# Patient Record
Sex: Male | Born: 1965 | Race: Asian | Hispanic: No | Marital: Married | Smoking: Former smoker
Health system: Southern US, Community
[De-identification: ages and names within clinical notes are randomized; demographics above are authoritative.]

## PROBLEM LIST (undated history)

## (undated) DIAGNOSIS — I2510 Atherosclerotic heart disease of native coronary artery without angina pectoris: Secondary | ICD-10-CM

## (undated) DIAGNOSIS — I10 Essential (primary) hypertension: Secondary | ICD-10-CM

## (undated) DIAGNOSIS — K219 Gastro-esophageal reflux disease without esophagitis: Secondary | ICD-10-CM

## (undated) DIAGNOSIS — E119 Type 2 diabetes mellitus without complications: Secondary | ICD-10-CM

## (undated) DIAGNOSIS — Z720 Tobacco use: Secondary | ICD-10-CM

## (undated) DIAGNOSIS — E785 Hyperlipidemia, unspecified: Secondary | ICD-10-CM

## (undated) HISTORY — PX: CORONARY STENT PLACEMENT: SHX1402

## (undated) HISTORY — PX: SHOULDER SURGERY: SHX246

---

## 2013-12-19 ENCOUNTER — Ambulatory Visit (INDEPENDENT_AMBULATORY_CARE_PROVIDER_SITE_OTHER): Payer: BC Managed Care – PPO | Admitting: Internal Medicine

## 2013-12-19 ENCOUNTER — Emergency Department (HOSPITAL_COMMUNITY): Payer: BC Managed Care – PPO

## 2013-12-19 ENCOUNTER — Encounter (HOSPITAL_COMMUNITY): Payer: Self-pay | Admitting: Emergency Medicine

## 2013-12-19 ENCOUNTER — Inpatient Hospital Stay (HOSPITAL_COMMUNITY)
Admission: EM | Admit: 2013-12-19 | Discharge: 2013-12-21 | DRG: 247 | Disposition: A | Discharge status: Home / Self Care | Payer: BC Managed Care – PPO | Attending: Cardiology | Admitting: Cardiology

## 2013-12-19 VITALS — BP 140/84 | HR 95 | Temp 97.3°F | Resp 16 | Ht 72.5 in | Wt 227.0 lb

## 2013-12-19 DIAGNOSIS — I214 Non-ST elevation (NSTEMI) myocardial infarction: Secondary | ICD-10-CM

## 2013-12-19 DIAGNOSIS — N189 Chronic kidney disease, unspecified: Secondary | ICD-10-CM | POA: Diagnosis present

## 2013-12-19 DIAGNOSIS — R079 Chest pain, unspecified: Secondary | ICD-10-CM

## 2013-12-19 DIAGNOSIS — E119 Type 2 diabetes mellitus without complications: Secondary | ICD-10-CM

## 2013-12-19 DIAGNOSIS — IMO0002 Reserved for concepts with insufficient information to code with codable children: Secondary | ICD-10-CM | POA: Insufficient documentation

## 2013-12-19 DIAGNOSIS — I25119 Atherosclerotic heart disease of native coronary artery with unspecified angina pectoris: Secondary | ICD-10-CM

## 2013-12-19 DIAGNOSIS — E1165 Type 2 diabetes mellitus with hyperglycemia: Secondary | ICD-10-CM | POA: Diagnosis present

## 2013-12-19 DIAGNOSIS — K219 Gastro-esophageal reflux disease without esophagitis: Secondary | ICD-10-CM | POA: Diagnosis present

## 2013-12-19 DIAGNOSIS — M19019 Primary osteoarthritis, unspecified shoulder: Secondary | ICD-10-CM | POA: Diagnosis present

## 2013-12-19 DIAGNOSIS — I10 Essential (primary) hypertension: Secondary | ICD-10-CM | POA: Insufficient documentation

## 2013-12-19 DIAGNOSIS — Z7982 Long term (current) use of aspirin: Secondary | ICD-10-CM

## 2013-12-19 DIAGNOSIS — I2510 Atherosclerotic heart disease of native coronary artery without angina pectoris: Principal | ICD-10-CM

## 2013-12-19 DIAGNOSIS — F1010 Alcohol abuse, uncomplicated: Secondary | ICD-10-CM | POA: Diagnosis present

## 2013-12-19 DIAGNOSIS — Z7902 Long term (current) use of antithrombotics/antiplatelets: Secondary | ICD-10-CM

## 2013-12-19 DIAGNOSIS — R1013 Epigastric pain: Secondary | ICD-10-CM

## 2013-12-19 DIAGNOSIS — F17200 Nicotine dependence, unspecified, uncomplicated: Secondary | ICD-10-CM | POA: Diagnosis present

## 2013-12-19 DIAGNOSIS — I200 Unstable angina: Secondary | ICD-10-CM | POA: Diagnosis present

## 2013-12-19 DIAGNOSIS — T82868A Thrombosis of vascular prosthetic devices, implants and grafts, initial encounter: Secondary | ICD-10-CM

## 2013-12-19 DIAGNOSIS — IMO0001 Reserved for inherently not codable concepts without codable children: Secondary | ICD-10-CM | POA: Diagnosis present

## 2013-12-19 DIAGNOSIS — E785 Hyperlipidemia, unspecified: Secondary | ICD-10-CM | POA: Diagnosis present

## 2013-12-19 DIAGNOSIS — I129 Hypertensive chronic kidney disease with stage 1 through stage 4 chronic kidney disease, or unspecified chronic kidney disease: Secondary | ICD-10-CM | POA: Diagnosis present

## 2013-12-19 DIAGNOSIS — Z720 Tobacco use: Secondary | ICD-10-CM

## 2013-12-19 DIAGNOSIS — I2582 Chronic total occlusion of coronary artery: Secondary | ICD-10-CM | POA: Diagnosis present

## 2013-12-19 DIAGNOSIS — Z800 Family history of malignant neoplasm of digestive organs: Secondary | ICD-10-CM

## 2013-12-19 DIAGNOSIS — Z79899 Other long term (current) drug therapy: Secondary | ICD-10-CM

## 2013-12-19 DIAGNOSIS — K3189 Other diseases of stomach and duodenum: Secondary | ICD-10-CM | POA: Diagnosis not present

## 2013-12-19 DIAGNOSIS — Z955 Presence of coronary angioplasty implant and graft: Secondary | ICD-10-CM

## 2013-12-19 HISTORY — DX: Tobacco use: Z72.0

## 2013-12-19 HISTORY — DX: Gastro-esophageal reflux disease without esophagitis: K21.9

## 2013-12-19 HISTORY — DX: Hyperlipidemia, unspecified: E78.5

## 2013-12-19 HISTORY — DX: Atherosclerotic heart disease of native coronary artery without angina pectoris: I25.10

## 2013-12-19 HISTORY — DX: Type 2 diabetes mellitus without complications: E11.9

## 2013-12-19 HISTORY — DX: Essential (primary) hypertension: I10

## 2013-12-19 LAB — CBC WITH DIFFERENTIAL/PLATELET
BASOS ABS: 0 10*3/uL (ref 0.0–0.1)
Basophils Relative: 0 % (ref 0–1)
EOS ABS: 0.1 10*3/uL (ref 0.0–0.7)
EOS PCT: 1 % (ref 0–5)
HCT: 43 % (ref 39.0–52.0)
Hemoglobin: 15.6 g/dL (ref 13.0–17.0)
LYMPHS ABS: 1.6 10*3/uL (ref 0.7–4.0)
Lymphocytes Relative: 21 % (ref 12–46)
MCH: 31.3 pg (ref 26.0–34.0)
MCHC: 36.3 g/dL — ABNORMAL HIGH (ref 30.0–36.0)
MCV: 86.3 fL (ref 78.0–100.0)
MONO ABS: 0.4 10*3/uL (ref 0.1–1.0)
Monocytes Relative: 6 % (ref 3–12)
Neutro Abs: 5.3 10*3/uL (ref 1.7–7.7)
Neutrophils Relative %: 72 % (ref 43–77)
Platelets: 236 10*3/uL (ref 150–400)
RBC: 4.98 MIL/uL (ref 4.22–5.81)
RDW: 12.2 % (ref 11.5–15.5)
WBC: 7.3 10*3/uL (ref 4.0–10.5)

## 2013-12-19 LAB — BASIC METABOLIC PANEL
BUN: 20 mg/dL (ref 6–23)
CO2: 21 meq/L (ref 19–32)
CREATININE: 0.58 mg/dL (ref 0.50–1.35)
Calcium: 8.8 mg/dL (ref 8.4–10.5)
Chloride: 98 mEq/L (ref 96–112)
GFR calc Af Amer: >90 mL/min (ref >90)
GFR calc non Af Amer: >90 mL/min (ref >90)
GLUCOSE: 349 mg/dL — AB (ref 70–99)
Potassium: 4 mEq/L (ref 3.7–5.3)
Sodium: 136 mEq/L — ABNORMAL LOW (ref 137–147)

## 2013-12-19 LAB — POCT I-STAT TROPONIN I: TROPONIN I, POC: 0.09 ng/mL — AB (ref 0.00–0.08)

## 2013-12-19 LAB — GLUCOSE, POCT (MANUAL RESULT ENTRY): POC Glucose: 428 mg/dl — AB (ref 70–99)

## 2013-12-19 LAB — TROPONIN I

## 2013-12-19 MED ORDER — ONDANSETRON HCL 4 MG/2ML IJ SOLN
4.0000 mg | Freq: Once | INTRAMUSCULAR | Status: DC
  Filled 2013-12-19: qty 2

## 2013-12-19 MED ORDER — HEPARIN (PORCINE) IN NACL 100-0.45 UNIT/ML-% IJ SOLN
1350.0000 [IU]/h | INTRAMUSCULAR | Status: DC
  Administered 2013-12-19: 1200 [IU]/h via INTRAVENOUS
  Filled 2013-12-19 (×2): qty 250

## 2013-12-19 MED ORDER — ASPIRIN 81 MG PO CHEW
324.0000 mg | CHEWABLE_TABLET | Freq: Once | ORAL | Status: AC
  Administered 2013-12-19: 324 mg via ORAL

## 2013-12-19 MED ORDER — MORPHINE SULFATE 4 MG/ML IJ SOLN
4.0000 mg | INTRAMUSCULAR | Status: DC | PRN
  Filled 2013-12-19: qty 1

## 2013-12-19 MED ORDER — NITROGLYCERIN 0.4 MG SL SUBL
0.4000 mg | SUBLINGUAL_TABLET | SUBLINGUAL | Status: DC | PRN
  Administered 2013-12-19 (×2): 0.4 mg via SUBLINGUAL
  Filled 2013-12-19 (×2): qty 25

## 2013-12-19 MED ORDER — HEPARIN (PORCINE) IN NACL 100-0.45 UNIT/ML-% IJ SOLN: 12.0000 [IU]/kg/h | INTRAMUSCULAR | Status: DC

## 2013-12-19 MED ORDER — HEPARIN SODIUM (PORCINE) 5000 UNIT/ML IJ SOLN: 60.0000 [IU]/kg | Freq: Once | INTRAMUSCULAR | Status: DC

## 2013-12-19 MED ORDER — HEPARIN BOLUS VIA INFUSION
4000.0000 [IU] | Freq: Once | INTRAVENOUS | Status: AC
  Administered 2013-12-19: 4000 [IU] via INTRAVENOUS
  Filled 2013-12-19: qty 4000

## 2013-12-19 NOTE — ED Notes (Signed)
EKG was done at 20:35

## 2013-12-19 NOTE — ED Notes (Signed)
Per wife, pt had sudden onset chest pain radiating to left neck for approx x10 minutes and then eased off. Pt. Was SOB, dizziness and diaphoretic with "big heart beat". Pt. Then had another episode and went to urgent care. Pt. Currently comfortable sitting in bed

## 2013-12-19 NOTE — H&P (Signed)
History and Physical  Patient ID: Drema HalonSeungwoo Miranda MRN: [id number], SOB: 1966/06/27 48 y.o. Date of Encounter: 12/19/2013, 10:58 PM  Primary Physician: Dr. Evelina BucyEdwin Aubuere with [facility] Primary Cardiologist: unassigned  Chief Complaint: chest pain  HPI: 48 y.o. male w/ PMHx significant for HTN, diabetes, tobacco abuse who presented to urgent care initially with chest pain and then [facility] with complaints of chest pain.  Patient with limited English. Interpretation provided by wife. No known cardiac history. Several episodes today of chest discomfort. Initially starts as a "hard heartbeat" with discomfort radiating up into his neck. Symptoms last for 15 minutes and then resolve. Associated with palpitations, lightheadedness, diaphoresis and radiation to neck. No syncope. Occurred with shelling shrimp but also with resting. Yesterday felt fine, no symptoms. Last night, frequent urination but otherwise okay.  At urgent care, was given aspirin and transferred due to EKG changes. Was still having dull chest pressure, relieved with nitro x 2.  EKG revealed NSR with j point in V2-v3, no reciprocal changes, widened qrs (no comparison). CXR was without acute cardiopulmonary abnormalities. Labs are significant for elevated POC troponin and glucose > 350. xcray without changes per ER. Heparin started.   PMH: 1. HTN 2. DM2, on orals 3. GERD 4. Tobacco abuse 5. H/o of excessive ETOH 6. Arthritis of the R shoulder  Surgical History:  Past Surgical History  Procedure Laterality Date  . Shoulder surgery Right      Home Meds: Prior to Admission medications   Medication Sig Start Date End Date Taking? Authorizing Provider  lisinopril (PRINIVIL,ZESTRIL) 20 MG tablet Take 20 mg by mouth 2 (two) times daily.    Yes Historical Provider, MD  metFORMIN (GLUCOPHAGE) 500 MG tablet Take 500 mg by mouth 2 (two) times daily.   Yes Historical Provider, MD   SH: 1  ppd tobacco Sounds like occasional binge drinking, previously heavy daily drinker  Allergies: No Known Allergies  FH: Liver cancer in father No cardiac dz in either m or f   Review of Systems:* General: negative for chills, fever, night sweats or weight changes.  Cardiovascular: see HPI Dermatological: negative for rash Respiratory: negative for cough or wheezing Urologic: negative for hematuria Abdominal: negative for nausea, vomiting, diarrhea, bright red blood per rectum, melena, or hematemesis Neurologic: negative for visual changes, syncope, or dizziness All other systems reviewed and are otherwise negative except as noted above.  Labs:   Lab Results  Component Value Date   WBC 7.3 12/19/2013   HGB 15.6 12/19/2013   HCT 43.0 12/19/2013   MCV 86.3 12/19/2013   PLT 236 12/19/2013    Recent Labs Lab 12/19/13 2109  NA 136*  K 4.0  CL 98  CO2 21  BUN 20  CREATININE 0.58  CALCIUM 8.8  GLUCOSE 349*   No results found for this basename: CKTOTAL, CKMB, TROPONINI,  in the last 72 hours No results found for this basename: CHOL, HDL, LDLCALC, TRIG   No results found for this basename: DDIMER    Radiology/Studies:  No results found.   EKG: sinus, j point in v2-v3, no reciprocal changes. widened qrs,  cxray pending.  Physical Exam: Blood pressure 115/81, pulse 71, temperature 97.8 F (36.6 C), temperature source Oral, resp. rate 17, SpO2 99.00%. General: Well developed, well nourished, in no acute distress. Head: Normocephalic, atraumatic, sclera non-icteric, nares are without discharge Neck: Supple. Negative for carotid bruits. JVD not elevated. Lungs: Clear bilaterally to auscultation without wheezes, rales,  or rhonchi. Breathing is unlabored. Heart: RRR with S1 S2. No murmurs, rubs, or gallops appreciated. Abdomen: Soft, non-tender, non-distended with normoactive bowel sounds. No rebound/guarding. No obvious abdominal masses. Msk:  Strength and tone appear normal  for age. Extremities: No edema. No clubbing or cyanosis. Distal pedal pulses are 2+ and equal bilaterally. Neuro: Alert and oriented X 3. Moves all extremities spontaneously. Psych:  Responds to questions appropriately with a normal affect.   Problem List 1. Chest pain, typical with elevated troponin c/w NSTEMI 2. DM2, poorly controlled 3. Tobacco abuse 4. Hypertension 5. H/o excessive ETOH, improved  ASSESSMENT AND PLAN:  48 y.o. male w/ PMHx significant for HTN, diabetes, tobacco abuse who presented to urgent care initially with chest pain and then transferred to [facility] on 12/19/2013 with complaints of chest pain --> POC troponin slightly elevated.  Based upon symptoms (typical chest pain) and risk factors (tobacco, HTN, DM2), very high suspicion for acute coronary syndrome which is further supported by the slightly elevated POC troponin. Abnormal EKG with IVCD, nonspecific. Repeat troponin pending. Encouragingly, he is currently chest pain free. Treat with beta blocker, statin, aspirin, heparin due to elevated TIMI score, ACEI and prepare for invasive strategy in the AM.   Smoking cessation discussed > 5 minutes and strongly emphasized. Wife is on board, husband precontemplative.  Diabetes poorly controlled. Will check HgA1c but likely will need increased metformin at discharged. Holding in anticipation of dye, covering with sliding scale.  Discussed ETOH. Sounds like it was a problem in the past now improved. Emphasized that for a male, more than 2 drinks a day was considered excessive.  Prophylaxis: Heparin gtt NPO for cath  Full code.  Signed, Adolm Joseph, Troy Sine MD 12/19/2013, 10:58 PM

## 2013-12-19 NOTE — ED Provider Notes (Signed)
CSN: [id number]     Arrival date & time 12/19/13  2028 History   First MD Initiated Contact with Patient 12/19/13 2033     Chief Complaint  Patient presents with  . Chest Pain  . Hyperglycemia      HPI  Patient presents with an episode of chest pain. Has no documented history of cardiac disease. First episode was approximately 3 hours before his arrival here. He describes sensation of the heartbeat in his chest. He felt pain in his left chest and into his left neck and jaw. This lasted for about 10 minutes. He was standing at the time. He states he felt dizzy, however he did not sit down. This past. He then laid down. Approximately 30 minutes later recurred again. Again a sudden episode of pain in the left chest and neck. This again lasted 10-20 minutes. He presents here without the acute pain but with some persistent heaviness in his anterior chest.  Not currently short of breath not currently nauseated.   History reviewed. No pertinent past medical history. Past Surgical History  Procedure Laterality Date  . Shoulder surgery Right    History reviewed. No pertinent family history. History  Substance Use Topics  . Smoking status: Not on file  . Smokeless tobacco: Not on file  . Alcohol Use: Not on file    Review of Systems  Constitutional: Negative for fever, chills, diaphoresis, appetite change and fatigue.  HENT: Negative for mouth sores, sore throat and trouble swallowing.   Eyes: Negative for visual disturbance.  Respiratory: Negative for cough, chest tightness, shortness of breath and wheezing.   Cardiovascular: Positive for chest pain.  Gastrointestinal: Negative for nausea, vomiting, abdominal pain, diarrhea and abdominal distention.  Endocrine: Negative for polydipsia, polyphagia and polyuria.  Genitourinary: Negative for dysuria, frequency and hematuria.  Musculoskeletal: Negative for gait problem.  Skin: Negative for color change, pallor and rash.  Neurological:  Negative for dizziness, syncope, light-headedness and headaches.  Hematological: Does not bruise/bleed easily.  Psychiatric/Behavioral: Negative for behavioral problems and confusion.      Allergies  Review of patient's allergies indicates no known allergies.  Home Medications   Current Outpatient Rx  Name  Route  Sig  Dispense  Refill  . lisinopril (PRINIVIL,ZESTRIL) 20 MG tablet   Oral   Take 20 mg by mouth 2 (two) times daily.          . metFORMIN (GLUCOPHAGE) 500 MG tablet   Oral   Take 500 mg by mouth 2 (two) times daily.          BP 117/81  Pulse 69  Temp(Src) 97.8 F (36.6 C) (Oral)  Resp 11  Ht 6' 0.44" (1.84 m)  Wt 227 lb 1.2 oz (103 kg)  BMI 30.42 kg/m2  SpO2 96% Physical Exam  Constitutional: He is oriented to person, place, and time. He appears well-developed and well-nourished. No distress.  HENT:  Head: Normocephalic.  Eyes: Conjunctivae are normal. Pupils are equal, round, and reactive to light. No scleral icterus.  Neck: Normal range of motion. Neck supple. No thyromegaly present.  Cardiovascular: Normal rate and regular rhythm.  Exam reveals no gallop and no friction rub.   No murmur heard. Pulmonary/Chest: Effort normal and breath sounds normal. No respiratory distress. He has no wheezes. He has no rales.  Abdominal: Soft. Bowel sounds are normal. He exhibits no distension. There is no tenderness. There is no rebound.  Musculoskeletal: Normal range of motion.  Neurological: He is alert and oriented  to person, place, and time.  Skin: Skin is warm and dry. No rash noted.  Psychiatric: He has a normal mood and affect. His behavior is normal.    ED Course  Procedures (including critical care time) Labs Review Labs Reviewed  CBC WITH DIFFERENTIAL - Abnormal; Notable for the following:    MCHC 36.3 (*)    All other components within normal limits  BASIC METABOLIC PANEL - Abnormal; Notable for the following:    Sodium 136 (*)    Glucose, Bld 349  (*)    All other components within normal limits  POCT I-STAT TROPONIN I - Abnormal; Notable for the following:    Troponin i, poc 0.09 (*)    All other components within normal limits  TROPONIN I  HEPARIN LEVEL (UNFRACTIONATED)  CBC   Imaging Review No results found.  EKG Interpretation   None       MDM   Final diagnoses:  Chest pain    Became pain free after 2 nitroglycerin here. Shows repolarization abnormality her elevated J point without reciprocal changes in lead V2 only. This is not dynamic. It is unchanged after his pain resolves. Procedure pointed at 0.09. Lab troponin is less than 0.30.    Discussed case with cardiology. M.D. cardiology evaluation emergency room. I also recommend heparin bolus and drip. He has no contraindications. His given. He remains asymptomatic. He was given aspirin prior to his arrival 324 mg.    Rolland PorterMark Nell Gales, MD 12/19/13 2352

## 2013-12-19 NOTE — ED Notes (Addendum)
Jody Oberthaler-RN notified of elevated I-stat troponin. Dr. Fayrene FearingJames was unavailable so I placed slip on desk and notified  PA.

## 2013-12-19 NOTE — Progress Notes (Signed)
   Subjective:    Patient ID: Tristan Camacho, male    DOB: 23-Jul-1966, 48 y.o.   MRN: [id number]  HPI Pt presents to clinic with 1 hr h/o chest pain.  Started with what felt like a 'funny hard heart beat" and he got dizzy.  Then he developed chest pain which got a little better after about an hour, currently he is pain free.  He has had no ASA today.  He has known h/o DM which sugars running 200-300, and HTN - on meds for both.  When the chest pain started he had nausea with pain radiating into his left neck and shoulder.  He has never seen a cardiologist before.  He sees his PCP but unsure when he saw them last and what his last A1C was.   Pt speaks broken [place] - wife interprets most of visit.  Review of Systems  Constitutional: Positive for diaphoresis. Negative for fever and chills.  Cardiovascular: Positive for chest pain. Negative for palpitations.  Gastrointestinal: Positive for nausea.  Neurological: Positive for dizziness. Negative for headaches.       Objective:   Physical Exam  Vitals reviewed. Constitutional: He is oriented to person, place, and time. He appears well-developed and well-nourished.  HENT:  Head: Normocephalic and atraumatic.  Right Ear: External ear normal.  Left Ear: External ear normal.  Eyes: Conjunctivae are normal.  Neck: Normal range of motion.  Cardiovascular: Normal rate and normal heart sounds.   No murmur heard. Pulmonary/Chest: Effort normal and breath sounds normal. He has no wheezes.  Neurological: He is alert and oriented to person, place, and time.  Skin: Skin is warm and dry.  Psychiatric: He has a normal mood and affect. His behavior is normal. Judgment and thought content normal.   Results for orders placed in visit on 12/19/13  GLUCOSE, POCT (MANUAL RESULT ENTRY)      Result Value Range   POC Glucose 428 (*) 70 - 99 mg/dl   EKG - ST elevation in V3, V4     Assessment & Plan:  Chest pain - Plan: EKG 12-Lead, aspirin chewable  tablet 324 mg  Diabetes - Plan: POCT glucose (manual entry)  Diabetes mellitus type II, uncontrolled  HTN (hypertension)  Due to abnl EKG in the anterior leads and h/o HTN and uncontrolled DM - pt placed on O2, IV was started, he was given ASA 324mg  and [facility] EMS [facility].  D/w Dr Merla Richesoolittle.  Benny LennertSarah Veneda Kirksey PA-C 12/19/2013 8:16 PM  I was involved in this emergency case from the beginning, and I agree with the diagnosis and plan for emergent emergency room transfer as documented Robert P. Sandria Balesoolittle M.D.

## 2013-12-19 NOTE — Progress Notes (Signed)
ANTICOAGULATION CONSULT NOTE - Initial Consult  Pharmacy Consult for Heparin Indication: chest pain/ACS  No Known Allergies  Patient Measurements: Height: 6' 0.44" (184 cm) Weight: 227 lb 1.2 oz (103 kg) IBW/kg (Calculated) : 78.61 Heparin Dosing Weight: 100 kg  Vital Signs: Temp: 97.8 F (36.6 C) (02/10 2041) Temp src: Oral (02/10 2041) BP: 117/81 mmHg (02/10 2245) Pulse Rate: 69 (02/10 2245)  Labs:  Recent Labs  12/19/13 2109  HGB 15.6  HCT 43.0  PLT 236  CREATININE 0.58    Estimated Creatinine Clearance: 139.7 ml/min (by C-G formula based on Cr of 0.58).   Medical History: Htn  DM History reviewed. No pertinent past medical history.  Medications:  Zestril  Metformin  Assessment: 48 yo male with chest pain for heparin  Goal of Therapy:  Heparin level 0.3-0.7 units/ml Monitor platelets by anticoagulation protocol: Yes   Plan:  Heparin 4000 units Iv bolus, then 1200 units/hr Check heparin level in 6 hours.   Eddie Candlebbott, Maurya Nethery Vernon 12/19/2013,11:04 PM

## 2013-12-19 NOTE — ED Notes (Addendum)
Pt presents via [facility] from [facility] with c/o substernal CP with radiation to left jaw with heart palpitations and dizziness that began that began at approximately 1800 and lasted for approx 1 hour. CBG 320. Hx Diabetes and HTN. Pt given 325mg  ASA PTA. Pt is currently pain free.

## 2013-12-20 ENCOUNTER — Encounter (HOSPITAL_COMMUNITY)
Admission: EM | Disposition: A | Discharge status: Home / Self Care | Payer: BC Managed Care – PPO | Source: Home / Self Care | Attending: Cardiology

## 2013-12-20 ENCOUNTER — Encounter (HOSPITAL_COMMUNITY): Payer: Self-pay

## 2013-12-20 DIAGNOSIS — I2510 Atherosclerotic heart disease of native coronary artery without angina pectoris: Principal | ICD-10-CM

## 2013-12-20 DIAGNOSIS — E1165 Type 2 diabetes mellitus with hyperglycemia: Secondary | ICD-10-CM

## 2013-12-20 DIAGNOSIS — IMO0001 Reserved for inherently not codable concepts without codable children: Secondary | ICD-10-CM

## 2013-12-20 DIAGNOSIS — I10 Essential (primary) hypertension: Secondary | ICD-10-CM

## 2013-12-20 HISTORY — PX: LEFT HEART CATHETERIZATION WITH CORONARY ANGIOGRAM: SHX5451

## 2013-12-20 LAB — GLUCOSE, CAPILLARY
GLUCOSE-CAPILLARY: 153 mg/dL — AB (ref 70–99)
Glucose-Capillary: 258 mg/dL — ABNORMAL HIGH (ref 70–99)
Glucose-Capillary: 150 mg/dL — ABNORMAL HIGH (ref 70–99)
Glucose-Capillary: 157 mg/dL — ABNORMAL HIGH (ref 70–99)
Glucose-Capillary: 234 mg/dL — ABNORMAL HIGH (ref 70–99)

## 2013-12-20 LAB — BASIC METABOLIC PANEL
BUN: 18 mg/dL (ref 6–23)
CO2: 22 meq/L (ref 19–32)
CREATININE: 0.62 mg/dL (ref 0.50–1.35)
Calcium: 8.6 mg/dL (ref 8.4–10.5)
Chloride: 99 mEq/L (ref 96–112)
GFR calc Af Amer: >90 mL/min (ref >90)
GFR calc non Af Amer: >90 mL/min (ref >90)
GLUCOSE: 213 mg/dL — AB (ref 70–99)
Potassium: 3.8 mEq/L (ref 3.7–5.3)
Sodium: 136 mEq/L — ABNORMAL LOW (ref 137–147)

## 2013-12-20 LAB — TROPONIN I
Troponin I: <0.3 ng/mL (ref <0.30)
Troponin I: <0.3 ng/mL (ref <0.30)

## 2013-12-20 LAB — HEPARIN LEVEL (UNFRACTIONATED): Heparin Unfractionated: 0.33 IU/mL (ref 0.30–0.70)

## 2013-12-20 LAB — POCT ACTIVATED CLOTTING TIME: Activated Clotting Time: 515 seconds

## 2013-12-20 LAB — LIPID PANEL
CHOL/HDL RATIO: 4.1 ratio
CHOLESTEROL: 157 mg/dL (ref 0–200)
HDL: 38 mg/dL — AB (ref >39)
LDL Cholesterol: 88 mg/dL (ref 0–99)
TRIGLYCERIDES: 154 mg/dL — AB (ref <150)
VLDL: 31 mg/dL (ref 0–40)

## 2013-12-20 LAB — PROTIME-INR
INR: 1.01 (ref 0.00–1.49)
Prothrombin Time: 13.1 seconds (ref 11.6–15.2)

## 2013-12-20 LAB — HEMOGLOBIN A1C
Hgb A1c MFr Bld: 8.9 % — ABNORMAL HIGH (ref <5.7)
Mean Plasma Glucose: 209 mg/dL — ABNORMAL HIGH (ref <117)

## 2013-12-20 LAB — CBC
HCT: 41.5 % (ref 39.0–52.0)
Hemoglobin: 14.9 g/dL (ref 13.0–17.0)
MCH: 31 pg (ref 26.0–34.0)
MCHC: 35.9 g/dL (ref 30.0–36.0)
MCV: 86.5 fL (ref 78.0–100.0)
Platelets: 248 10*3/uL (ref 150–400)
RBC: 4.8 MIL/uL (ref 4.22–5.81)
RDW: 12.2 % (ref 11.5–15.5)
WBC: 6.8 10*3/uL (ref 4.0–10.5)

## 2013-12-20 SURGERY — LEFT HEART CATHETERIZATION WITH CORONARY ANGIOGRAM
Anesthesia: Moderate Sedation | Laterality: Bilateral

## 2013-12-20 MED ORDER — ASPIRIN 81 MG PO CHEW: 81.0000 mg | CHEWABLE_TABLET | ORAL | Status: DC

## 2013-12-20 MED ORDER — TIROFIBAN HCL IV 5 MG/100ML
INTRAVENOUS | Status: AC
  Filled 2013-12-20: qty 100

## 2013-12-20 MED ORDER — SODIUM CHLORIDE 0.9 % IJ SOLN: 3.0000 mL | INTRAMUSCULAR | Status: DC | PRN

## 2013-12-20 MED ORDER — MIDAZOLAM HCL 2 MG/2ML IJ SOLN
INTRAMUSCULAR | Status: AC
  Filled 2013-12-20: qty 2

## 2013-12-20 MED ORDER — SODIUM CHLORIDE 0.9 % IJ SOLN: 3.0000 mL | Freq: Two times a day (BID) | INTRAMUSCULAR | Status: DC

## 2013-12-20 MED ORDER — SODIUM CHLORIDE 0.9 % IV SOLN
INTRAVENOUS | Status: AC
  Administered 2013-12-20 (×2): via INTRAVENOUS

## 2013-12-20 MED ORDER — VERAPAMIL HCL 2.5 MG/ML IV SOLN
INTRAVENOUS | Status: AC
  Filled 2013-12-20: qty 2

## 2013-12-20 MED ORDER — HEPARIN SODIUM (PORCINE) 1000 UNIT/ML IJ SOLN
INTRAMUSCULAR | Status: AC
  Filled 2013-12-20: qty 1

## 2013-12-20 MED ORDER — ATORVASTATIN CALCIUM 40 MG PO TABS
40.0000 mg | ORAL_TABLET | Freq: Every day | ORAL | Status: DC
  Administered 2013-12-20: 21:00:00 40 mg via ORAL
  Filled 2013-12-20 (×3): qty 1

## 2013-12-20 MED ORDER — SODIUM CHLORIDE 0.9 % IV SOLN: 250.0000 mL | INTRAVENOUS | Status: DC | PRN

## 2013-12-20 MED ORDER — LISINOPRIL 20 MG PO TABS
20.0000 mg | ORAL_TABLET | Freq: Two times a day (BID) | ORAL | Status: DC
Start: 2013-12-20 — End: 2013-12-21
  Administered 2013-12-20 – 2013-12-21 (×3): 20 mg via ORAL
  Filled 2013-12-20 (×6): qty 1

## 2013-12-20 MED ORDER — METOPROLOL TARTRATE 12.5 MG HALF TABLET
12.5000 mg | ORAL_TABLET | Freq: Two times a day (BID) | ORAL | Status: DC
  Administered 2013-12-20 – 2013-12-21 (×3): 12.5 mg via ORAL
  Filled 2013-12-20 (×6): qty 1

## 2013-12-20 MED ORDER — ASPIRIN 81 MG PO CHEW
81.0000 mg | CHEWABLE_TABLET | Freq: Every day | ORAL | Status: DC
  Administered 2013-12-21: 09:00:00 81 mg via ORAL
  Filled 2013-12-20: qty 1

## 2013-12-20 MED ORDER — TICAGRELOR 90 MG PO TABS
ORAL_TABLET | ORAL | Status: AC
  Filled 2013-12-20: qty 2

## 2013-12-20 MED ORDER — BIVALIRUDIN 250 MG IV SOLR
INTRAVENOUS | Status: AC
  Filled 2013-12-20: qty 250

## 2013-12-20 MED ORDER — LIDOCAINE HCL (PF) 1 % IJ SOLN
INTRAMUSCULAR | Status: AC
  Filled 2013-12-20: qty 30

## 2013-12-20 MED ORDER — FENTANYL CITRATE 0.05 MG/ML IJ SOLN
INTRAMUSCULAR | Status: AC
  Filled 2013-12-20: qty 2

## 2013-12-20 MED ORDER — HEPARIN (PORCINE) IN NACL 2-0.9 UNIT/ML-% IJ SOLN
INTRAMUSCULAR | Status: AC
  Filled 2013-12-20: qty 1500

## 2013-12-20 MED ORDER — NITROGLYCERIN 0.2 MG/ML ON CALL CATH LAB
INTRAVENOUS | Status: AC
  Filled 2013-12-20: qty 1

## 2013-12-20 MED ORDER — ASPIRIN EC 81 MG PO TBEC
81.0000 mg | DELAYED_RELEASE_TABLET | Freq: Every day | ORAL | Status: DC
  Administered 2013-12-20: 81 mg via ORAL
  Filled 2013-12-20: qty 1

## 2013-12-20 MED ORDER — INSULIN ASPART 100 UNIT/ML ~~LOC~~ SOLN
0.0000 [IU] | Freq: Three times a day (TID) | SUBCUTANEOUS | Status: DC
  Administered 2013-12-20: 3 [IU] via SUBCUTANEOUS
  Administered 2013-12-21: 09:00:00 5 [IU] via SUBCUTANEOUS

## 2013-12-20 MED ORDER — TICAGRELOR 90 MG PO TABS
90.0000 mg | ORAL_TABLET | Freq: Two times a day (BID) | ORAL | Status: DC
  Administered 2013-12-20 – 2013-12-21 (×2): 90 mg via ORAL
  Filled 2013-12-20 (×3): qty 1

## 2013-12-20 MED ORDER — ACETAMINOPHEN 325 MG PO TABS: 650.0000 mg | ORAL_TABLET | ORAL | Status: DC | PRN

## 2013-12-20 MED ORDER — ONDANSETRON HCL 4 MG/2ML IJ SOLN: 4.0000 mg | Freq: Four times a day (QID) | INTRAMUSCULAR | Status: DC | PRN

## 2013-12-20 MED ORDER — NITROGLYCERIN 0.4 MG SL SUBL
0.4000 mg | SUBLINGUAL_TABLET | SUBLINGUAL | Status: DC | PRN
Start: 2013-12-20 — End: 2013-12-21
  Administered 2013-12-21: 09:00:00 0.4 mg via SUBLINGUAL
  Filled 2013-12-20: qty 25

## 2013-12-20 MED ORDER — INSULIN ASPART 100 UNIT/ML ~~LOC~~ SOLN
0.0000 [IU] | Freq: Every day | SUBCUTANEOUS | Status: DC
  Administered 2013-12-20: 22:00:00 2 [IU] via SUBCUTANEOUS

## 2013-12-20 NOTE — CV Procedure (Signed)
Left Heart Catheterization with Coronary Angiography with PCI Report  Tristan Camacho  48 y.o.  male [birth year]  Procedure Date: 12/20/2013  Referring Physician: Allyson SabalBerry, M.D. Primary Cardiologist: Allyson SabalBerry, M.D./ Cherrie GauzeH. WB Katrinka BlazingSmith, III M.D.  INDICATIONS: Unstable angina pectoris  PROCEDURE: 1. Left heart catheterization; 2. Coronary angiography; 3. Left ventriculography; 4. LAD DES  CONSENT:  The risks, benefits, and details of the procedure were explained in detail to the patient. Risks including death, stroke, heart attack, kidney injury, allergy, limb ischemia, bleeding and radiation injury were discussed.  The patient verbalized understanding and wanted to proceed.  Informed written consent was obtained.  PROCEDURE TECHNIQUE:  After Xylocaine anesthesia a 5 French Slender sheath was placed in the right radial artery with double wall stick using Angiocath.  Coronary angiography was done using a 5 [place] JR 4 and JL 3.5 catheters.  Left ventriculography was done using the 5 [place] JR 4 catheter and hand injection. 5000 units of heparin was administered intravenously after gaining access to the descending aorta.  Angiography demonstrated chronically occluded distal right coronary with left to right collaterals. This obstruction is felt to represent a chronic total occlusion perhaps 50 years old or older. The LAD demonstrated a very eccentric 85-95% proximal stenosis. This was felt to be the significant obstruction given the patient's presentation. After discussion with the patient via his wife who served as interpreter, we decided to proceed with PCI on the LAD.  An XB LAD 6 [place] guide catheter positioned and guiding shots obtained. Bivalirudin bolus and infusion, weight-based, was administered and a therapeutic ACT was documented. A 190 cm 0.014 cougar wire was used to cross the stenosis in the LAD. A 3.0 x 12 mm Sprinter balloon was used for predilatation. We then positioned and deployed a 3.0 x  16 mm long Promus Premier deployed at nominal pressure. Postdilatation was then performed to 13 atmospheres with a 12 mm x 3.25 mm [place] balloon. TIMI grade 3 flow was noted post final inflation. 100 mcg of intracoronary nitroglycerin was administered. At the completion of the procedure, and Aggrastat bolus was administered. Bivalirudin was discontinued.  MEDICATIONS: Versed 3 mg IV; fentanyl 50 mcg IV; Aggrastat single bolus  CONTRAST:  Total of 220 cc.  COMPLICATIONS:  None   HEMODYNAMICS:  Aortic pressure 90/62 mmHg; LV pressure 90/1 mm mark; LVEDP 4 mmHg  ANGIOGRAPHIC DATA:   The left main coronary artery is normal.  The left anterior descending artery is eccentric proximal 90% stenosis.  The left circumflex artery is widely patent and gives origin to 2 obtuse marginal branches..  The right coronary artery is totally occluded distally with both right to right and left-to-right collaterals noted. This is felt to represent a chronic total occlusion possibly many years duration.  PCI RESULTS: 90+ percent stenosis, highly eccentric in the proximal LAD reduced to 0% with TIMI grade 3 flow noted post procedure. Complications  LEFT VENTRICULOGRAM:  Left ventricular angiogram was done in the 30 RAO projection and revealed an LVEF 60% without regional wall motion abnormality.   IMPRESSIONS:  1. Unstable angina felt secondary to high-grade stenosis in the proximal LAD. 2. Successful stenting of the proximal LAD from 90% to 0% with TIMI grade 3 flow 3. 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 Overall normal left ventricular function   RECOMMENDATION:  Aspirin and Brilinta for at least 12 months. Aggressive risk factor modification including  management of diabetes, smoking cessation, weight loss, exercise, and blood pressure control. Eligible for discharge in a.m. if stable without  complications

## 2013-12-20 NOTE — Progress Notes (Signed)
ANTICOAGULATION CONSULT NOTE  Pharmacy Consult for Heparin Indication: chest pain/ACS  No Known Allergies  Patient Measurements: Height: 6\' 1"  (185.4 cm) Weight: 221 lb 14.4 oz (100.653 kg) IBW/kg (Calculated) : 79.9 Heparin Dosing Weight: 100 kg  Vital Signs: Temp: 98.2 F (36.8 C) (02/11 0045) Temp src: Oral (02/11 0045) BP: 106/75 mmHg (02/11 0045) Pulse Rate: 76 (02/11 0045)  Labs:  Recent Labs  12/19/13 2109 12/19/13 2302 12/20/13 0313  HGB 15.6  --  14.9  HCT 43.0  --  41.5  PLT 236  --  248  LABPROT  --   --  13.1  INR  --   --  1.01  HEPARINUNFRC  --   --  0.33  CREATININE 0.58  --  0.62  TROPONINI  --  <0.30 <0.30    Estimated Creatinine Clearance: 139.3 ml/min (by C-G formula based on Cr of 0.62).  Assessment: 48 yo male with chest pain for heparin  Goal of Therapy:  Heparin level 0.3-0.7 units/ml Monitor platelets by anticoagulation protocol: Yes   Plan:  Increase Heparin 1350 units/hr to keep within range   Tawny Raspberry, Gary FleetGregory Vernon 12/20/2013,5:40 AM

## 2013-12-20 NOTE — Progress Notes (Signed)
TR BAND REMOVAL  LOCATION:    right radial  DEFLATED PER PROTOCOL:    yes  TIME BAND OFF / DRESSING APPLIED:    21:45   SITE UPON ARRIVAL:    Level 0  SITE AFTER BAND REMOVAL:    Level 0  REVERSE ALLEN'S TEST:       CIRCULATION SENSATION AND MOVEMENT:    Within Normal Limits   yes  COMMENTS:   Pt tolerated removal of TR band without complication, will continue to monitor patient

## 2013-12-20 NOTE — Progress Notes (Signed)
Subjective: No further CP   Objective: Vital signs in last 24 hours: Temp:  [97.3 F (36.3 C)-98.2 F (36.8 C)] 98.1 F (36.7 C) (02/11 0640) Pulse Rate:  [65-95] 65 (02/11 0640) Resp:  [11-19] 18 (02/11 0640) BP: (100-140)/(66-94) 100/69 mmHg (02/11 0640) SpO2:  [96 %-99 %] 97 % (02/11 0640) Weight:  [221 lb 14.4 oz (100.653 kg)-227 lb 6.4 oz (103.148 kg)] 221 lb 14.4 oz (100.653 kg) (02/11 0045) Last BM Date: 12/19/13  Intake/Output from previous day:   Intake/Output this shift:    Medications Current Facility-Administered Medications  Medication Dose Route Frequency Provider Last Rate Last Dose  . 0.9 %  sodium chloride infusion  250 mL Intravenous PRN Ardis RowanMatthew Whitlock, MD      . acetaminophen (TYLENOL) tablet 650 mg  650 mg Oral Q4H PRN Ardis RowanMatthew Whitlock, MD      . aspirin chewable tablet 81 mg  81 mg Oral Pre-Cath Ardis RowanMatthew Whitlock, MD      . aspirin EC tablet 81 mg  81 mg Oral Daily Ardis RowanMatthew Whitlock, MD      . atorvastatin (LIPITOR) tablet 40 mg  40 mg Oral q1800 Ardis RowanMatthew Whitlock, MD      . heparin ADULT infusion 100 units/mL (25000 units/250 mL)  1,350 Units/hr Intravenous Continuous Antoine PocheJonathan F Branch, MD 13.5 mL/hr at 12/20/13 0544 1,350 Units/hr at 12/20/13 0544  . insulin aspart (novoLOG) injection 0-15 Units  0-15 Units Subcutaneous TID WC Ardis RowanMatthew Whitlock, MD      . insulin aspart (novoLOG) injection 0-5 Units  0-5 Units Subcutaneous QHS Ardis RowanMatthew Whitlock, MD      . lisinopril (PRINIVIL,ZESTRIL) tablet 20 mg  20 mg Oral BID Ardis RowanMatthew Whitlock, MD   20 mg at 12/20/13 0505  . metoprolol tartrate (LOPRESSOR) tablet 12.5 mg  12.5 mg Oral BID Ardis RowanMatthew Whitlock, MD   12.5 mg at 12/20/13 0505  . nitroGLYCERIN (NITROSTAT) SL tablet 0.4 mg  0.4 mg Sublingual Q5 Min x 3 PRN Ardis RowanMatthew Whitlock, MD      . ondansetron [facility](ZOFRAN) injection 4 mg  4 mg Intravenous Q6H PRN Ardis RowanMatthew Whitlock, MD      . sodium chloride 0.9 % injection 3 mL  3 mL Intravenous Q12H Ardis RowanMatthew Whitlock, MD      .  sodium chloride 0.9 % injection 3 mL  3 mL Intravenous PRN Ardis RowanMatthew Whitlock, MD        PE: General appearance: alert, cooperative and no distress Lungs: clear to auscultation bilaterally Heart: regular rate and rhythm, S1, S2 normal, no murmur, click, rub or gallop Extremities: No LEE Pulses: 2+ and symmetric Skin: Warm and dry Neurologic: Grossly normal  Lab Results:   Recent Labs  12/19/13 2109 12/20/13 0313  WBC 7.3 6.8  HGB 15.6 14.9  HCT 43.0 41.5  PLT 236 248   BMET  Recent Labs  12/19/13 2109 12/20/13 0313  NA 136* 136*  K 4.0 3.8  CL 98 99  CO2 21 22  GLUCOSE 349* 213*  BUN 20 18  CREATININE 0.58 0.62  CALCIUM 8.8 8.6   PT/INR  Recent Labs  12/20/13 0313  LABPROT 13.1  INR 1.01   Cholesterol  Recent Labs  12/20/13 0313  CHOL 157   Lipid Panel     Component Value Date/Time   CHOL 157 12/20/2013 0313   TRIG 154* 12/20/2013 0313   HDL 38* 12/20/2013 0313   CHOLHDL 4.1 12/20/2013 0313   VLDL 31 12/20/2013 0313   LDLCALC 88 12/20/2013 0313  Cardiac Panel (last 3 results)  Recent Labs  12/19/13 2302 12/20/13 0313  TROPONINI <0.30 <0.30    Assessment/Plan   Active Problems:   NSTEMI (non-ST elevated myocardial infarction)  Plan:   48 y.o. male w/ PMHx significant for HTN, diabetes, tobacco abuse who presented to urgent care initially with chest pain and then transferred to [facility] on 12/19/2013 with complaints of chest pain.    Troponin negative x2.  IV heparin.  Lopressor, ASA, ACE.   LAFB on EKG.  Extensive Tobacco and ETOH history.  Added to cath schedule.  NPO  BP and HR stable.    Uncontrolled DM.  CBG better this morning.  A1C pending.   LOS: 1 day    HAGER, BRYAN 12/20/2013 9:23 AM   Agree with note written by [facility]  Admitted with sx c/w [place]. + CRF. Pain free now. EKG with NSSTTWC. Trop neg. For cath today.  Runell Gess 12/20/2013 10:39 AM

## 2013-12-20 NOTE — Care Management Note (Signed)
    Page 1 of 1   12/21/2013     1:39:57 PM   CARE MANAGEMENT NOTE 12/21/2013  Patient:  Drema HalonSON,Tarez   Account Number:  [id number]  Date Initiated:  12/20/2013  Documentation initiated by:  GRAVES-BIGELOW,BRENDA  Subjective/Objective Assessment:   Pt admitted for cp. POC troponin +. Plan for cath 12-20-13.     Action/Plan:   CM will continue to monitor for disposition needs.//Benefits check for Brilinta   Anticipated DC Date:  12/21/2013   Anticipated DC Plan:  HOME/SELF CARE      DC Planning Services  CM consult      Choice offered to / List presented to:             Status of service:  In process, will continue to follow Medicare Important Message given?   (If response is "NO", the following Medicare IM given date fields will be blank) Date Medicare IM given:   Date Additional Medicare IM given:    Discharge Disposition:    Per UR Regulation:  Reviewed for med. necessity/level of care/duration of stay  If discussed at Long Length of Stay Meetings, dates discussed:    Comments:  12/21/13 1330 Oletta Cohnamellia Marselino Slayton, RN, BSN, [employer] [phone] Spoke with pt at bedside regarding benefits check for Brilinta.  Pt has brochure with 30 day free card and refill assistance card intact.  [facility] [address] for prescription needs.  NCM called pharmacy to confirm availability of medication. Information relayed to pt.  Pt verbalizes importance of filling medication upon discharge.  PER [employer] [place] ONLINE:BRILINTA IS COVERED, TIER 3, $65.00 CO-PAY AT RETAIL

## 2013-12-20 NOTE — Progress Notes (Signed)
Pt orientation to unit, room and routine. Admission INP armband ID verified with patient and in place. Side rails in place, fall risk assessment complete with patient verbalizing understanding of risks associated with falls. Pt verbalizes an understanding of how to use the call bell and to call for help before getting out of bed.   Will cont to monitor and assist as needed.  Gilman Schmidtembrina, Kinsleigh Ludolph J, RN

## 2013-12-20 NOTE — Interval H&P Note (Signed)
History and Physical Interval Note:  12/20/2013 3:43 PM  The patient's history was reviewed. He was interviewed with his wife present who served as an Equities traderinterpreter. His symptoms have been chest discomfort in the midst of palpitations. His cardiac markers are unremarkable other than a point-of-care that was done in the emergency room. EKG is not ischemic. He has marked risk factors including diabetes, smoking, obesity, and family history of premature atherosclerosis. Kidney function is normal.  The procedure including the risk of stroke, death, myocardial infarction, bleeding, kidney failure, allergy, limb ischemia, emergency surgery, among others were discussed in detail with the patient and wife. All questions were answered and both the patient and wife consented to proceeding with the procedure as described.   Lesleigh NoeSMITH III,Taite Baldassari W

## 2013-12-20 NOTE — Progress Notes (Signed)
Pt had cbg of 258. Pt refused novolog SQ and only takes glucophage at home. Provided education to patient and wife, however language seems to be a barrier. Whitlock MD notified of patient's refusal. No new orders given at this time. Will continue to monitor. Gilman Schmidtembrina, Maurio Baize J

## 2013-12-20 NOTE — H&P (View-Only) (Signed)
Subjective: No further CP   Objective: Vital signs in last 24 hours: Temp:  [97.3 F (36.3 C)-98.2 F (36.8 C)] 98.1 F (36.7 C) (02/11 0640) Pulse Rate:  [65-95] 65 (02/11 0640) Resp:  [11-19] 18 (02/11 0640) BP: (100-140)/(66-94) 100/69 mmHg (02/11 0640) SpO2:  [96 %-99 %] 97 % (02/11 0640) Weight:  [221 lb 14.4 oz (100.653 kg)-227 lb 6.4 oz (103.148 kg)] 221 lb 14.4 oz (100.653 kg) (02/11 0045) Last BM Date: 12/19/13  Intake/Output from previous day:   Intake/Output this shift:    Medications Current Facility-Administered Medications  Medication Dose Route Frequency Provider Last Rate Last Dose  . 0.9 %  sodium chloride infusion  250 mL Intravenous PRN Ardis RowanMatthew Whitlock, MD      . acetaminophen (TYLENOL) tablet 650 mg  650 mg Oral Q4H PRN Ardis RowanMatthew Whitlock, MD      . aspirin chewable tablet 81 mg  81 mg Oral Pre-Cath Ardis RowanMatthew Whitlock, MD      . aspirin EC tablet 81 mg  81 mg Oral Daily Ardis RowanMatthew Whitlock, MD      . atorvastatin (LIPITOR) tablet 40 mg  40 mg Oral q1800 Ardis RowanMatthew Whitlock, MD      . heparin ADULT infusion 100 units/mL (25000 units/250 mL)  1,350 Units/hr Intravenous Continuous Antoine PocheJonathan F Branch, MD 13.5 mL/hr at 12/20/13 0544 1,350 Units/hr at 12/20/13 0544  . insulin aspart (novoLOG) injection 0-15 Units  0-15 Units Subcutaneous TID WC Ardis RowanMatthew Whitlock, MD      . insulin aspart (novoLOG) injection 0-5 Units  0-5 Units Subcutaneous QHS Ardis RowanMatthew Whitlock, MD      . lisinopril (PRINIVIL,ZESTRIL) tablet 20 mg  20 mg Oral BID Ardis RowanMatthew Whitlock, MD   20 mg at 12/20/13 0505  . metoprolol tartrate (LOPRESSOR) tablet 12.5 mg  12.5 mg Oral BID Ardis RowanMatthew Whitlock, MD   12.5 mg at 12/20/13 0505  . nitroGLYCERIN (NITROSTAT) SL tablet 0.4 mg  0.4 mg Sublingual Q5 Min x 3 PRN Ardis RowanMatthew Whitlock, MD      . ondansetron [facility](ZOFRAN) injection 4 mg  4 mg Intravenous Q6H PRN Ardis RowanMatthew Whitlock, MD      . sodium chloride 0.9 % injection 3 mL  3 mL Intravenous Q12H Ardis RowanMatthew Whitlock, MD      .  sodium chloride 0.9 % injection 3 mL  3 mL Intravenous PRN Ardis RowanMatthew Whitlock, MD        PE: General appearance: alert, cooperative and no distress Lungs: clear to auscultation bilaterally Heart: regular rate and rhythm, S1, S2 normal, no murmur, click, rub or gallop Extremities: No LEE Pulses: 2+ and symmetric Skin: Warm and dry Neurologic: Grossly normal  Lab Results:   Recent Labs  12/19/13 2109 12/20/13 0313  WBC 7.3 6.8  HGB 15.6 14.9  HCT 43.0 41.5  PLT 236 248   BMET  Recent Labs  12/19/13 2109 12/20/13 0313  NA 136* 136*  K 4.0 3.8  CL 98 99  CO2 21 22  GLUCOSE 349* 213*  BUN 20 18  CREATININE 0.58 0.62  CALCIUM 8.8 8.6   PT/INR  Recent Labs  12/20/13 0313  LABPROT 13.1  INR 1.01   Cholesterol  Recent Labs  12/20/13 0313  CHOL 157   Lipid Panel     Component Value Date/Time   CHOL 157 12/20/2013 0313   TRIG 154* 12/20/2013 0313   HDL 38* 12/20/2013 0313   CHOLHDL 4.1 12/20/2013 0313   VLDL 31 12/20/2013 0313   LDLCALC 88 12/20/2013 0313  Cardiac Panel (last 3 results)  Recent Labs  12/19/13 2302 12/20/13 0313  TROPONINI <0.30 <0.30    Assessment/Plan   Active Problems:   NSTEMI (non-ST elevated myocardial infarction)  Plan:   48 y.o. male w/ PMHx significant for HTN, diabetes, tobacco abuse who presented to urgent care initially with chest pain and then transferred to [facility] on 12/19/2013 with complaints of chest pain.    Troponin negative x2.  IV heparin.  Lopressor, ASA, ACE.   LAFB on EKG.  Extensive Tobacco and ETOH history.  Added to cath schedule.  NPO  BP and HR stable.    Uncontrolled DM.  CBG better this morning.  A1C pending.   LOS: 1 day    HAGER, BRYAN 12/20/2013 9:23 AM   Agree with note written by [facility]  Admitted with sx c/w [place]. + CRF. Pain free now. EKG with NSSTTWC. Trop neg. For cath today.  Runell Gess 12/20/2013 10:39 AM

## 2013-12-20 NOTE — Interval H&P Note (Signed)
Cath Lab Visit (complete for each Cath Lab visit)  Clinical Evaluation Leading to the Procedure:   ACS: yes  Non-ACS:    Anginal Classification: CCS IV  Anti-ischemic medical therapy: No Therapy  Non-Invasive Test Results: No non-invasive testing performed  Prior CABG: No previous CABG      History and Physical Interval Note:  12/20/2013 3:42 PM  Tristan Camacho  has presented today for surgery, with the diagnosis of cp  The various methods of treatment have been discussed with the patient and family. After consideration of risks, benefits and other options for treatment, the patient has consented to  Procedure(s): LEFT HEART CATHETERIZATION WITH CORONARY ANGIOGRAM (Bilateral) as a surgical intervention .  The patient's history has been reviewed, patient examined, no change in status, stable for surgery.  I have reviewed the patient's chart and labs.  Questions were answered to the patient's satisfaction.     Lesleigh NoeSMITH III,Kelsa Jaworowski W

## 2013-12-21 ENCOUNTER — Encounter (HOSPITAL_COMMUNITY): Payer: Self-pay | Admitting: Nurse Practitioner

## 2013-12-21 DIAGNOSIS — I25119 Atherosclerotic heart disease of native coronary artery with unspecified angina pectoris: Secondary | ICD-10-CM

## 2013-12-21 DIAGNOSIS — I2510 Atherosclerotic heart disease of native coronary artery without angina pectoris: Secondary | ICD-10-CM

## 2013-12-21 DIAGNOSIS — Z720 Tobacco use: Secondary | ICD-10-CM

## 2013-12-21 DIAGNOSIS — E785 Hyperlipidemia, unspecified: Secondary | ICD-10-CM | POA: Diagnosis present

## 2013-12-21 DIAGNOSIS — R079 Chest pain, unspecified: Secondary | ICD-10-CM

## 2013-12-21 LAB — GLUCOSE, CAPILLARY: GLUCOSE-CAPILLARY: 223 mg/dL — AB (ref 70–99)

## 2013-12-21 LAB — CBC
HCT: 42 % (ref 39.0–52.0)
Hemoglobin: 15.2 g/dL (ref 13.0–17.0)
MCH: 31.5 pg (ref 26.0–34.0)
MCHC: 36.2 g/dL — ABNORMAL HIGH (ref 30.0–36.0)
MCV: 87 fL (ref 78.0–100.0)
PLATELETS: 234 10*3/uL (ref 150–400)
RBC: 4.83 MIL/uL (ref 4.22–5.81)
RDW: 12.2 % (ref 11.5–15.5)
WBC: 6.6 10*3/uL (ref 4.0–10.5)

## 2013-12-21 LAB — BASIC METABOLIC PANEL
BUN: 15 mg/dL (ref 6–23)
CO2: 23 mEq/L (ref 19–32)
Calcium: 9 mg/dL (ref 8.4–10.5)
Chloride: 101 mEq/L (ref 96–112)
Creatinine, Ser: 0.63 mg/dL (ref 0.50–1.35)
GFR calc Af Amer: >90 mL/min (ref >90)
GLUCOSE: 241 mg/dL — AB (ref 70–99)
Potassium: 4.2 mEq/L (ref 3.7–5.3)
Sodium: 138 mEq/L (ref 137–147)

## 2013-12-21 MED ORDER — ALUM & MAG HYDROXIDE-SIMETH 200-200-20 MG/5ML PO SUSP
30.0000 mL | ORAL | Status: DC | PRN
  Administered 2013-12-21: 30 mL via ORAL
  Filled 2013-12-21: qty 30

## 2013-12-21 MED ORDER — METFORMIN HCL 1000 MG PO TABS: 1000.0000 mg | ORAL_TABLET | Freq: Two times a day (BID) | ORAL | Status: DC

## 2013-12-21 MED ORDER — TICAGRELOR 90 MG PO TABS: 90.0000 mg | ORAL_TABLET | Freq: Two times a day (BID) | ORAL | Status: DC

## 2013-12-21 MED ORDER — ASPIRIN 81 MG PO CHEW: 81.0000 mg | CHEWABLE_TABLET | Freq: Every day | ORAL | Status: DC

## 2013-12-21 MED ORDER — ATORVASTATIN CALCIUM 40 MG PO TABS: 40.0000 mg | ORAL_TABLET | Freq: Every day | ORAL | Status: DC

## 2013-12-21 MED ORDER — NITROGLYCERIN 0.4 MG SL SUBL: 0.4000 mg | SUBLINGUAL_TABLET | SUBLINGUAL | Status: DC | PRN

## 2013-12-21 MED ORDER — METOPROLOL TARTRATE 25 MG PO TABS
12.5000 mg | ORAL_TABLET | Freq: Two times a day (BID) | ORAL | Status: DC
Start: 2013-12-21 — End: 2014-07-31

## 2013-12-21 MED FILL — Sodium Chloride IV Soln 0.9%: INTRAVENOUS | Qty: 50 | Status: AC

## 2013-12-21 NOTE — Progress Notes (Signed)
Patient Name: Tristan Camacho Date of Encounter: 12/21/2013   Principal Problem:   Unstable angina pectoris Active Problems:   CAD (coronary artery disease)   Diabetes mellitus type II, uncontrolled   Tobacco abuse   HTN (hypertension)   Hyperlipidemia   SUBJECTIVE  No chest pain or sob overnight.  No right wrist pain.  Eager to go home.  CURRENT MEDS . aspirin  81 mg Oral Daily  . atorvastatin  40 mg Oral q1800  . insulin aspart  0-15 Units Subcutaneous TID WC  . insulin aspart  0-5 Units Subcutaneous QHS  . lisinopril  20 mg Oral BID  . metoprolol tartrate  12.5 mg Oral BID  . Ticagrelor  90 mg Oral BID    OBJECTIVE  Filed Vitals:   12/20/13 1830 12/20/13 2109 12/21/13 0023 12/21/13 0607  BP: 134/84 118/84 117/78 127/87  Pulse:  71 70 71  Temp:  97.1 F (36.2 C) 97.4 F (36.3 C) 98.3 F (36.8 C)  TempSrc:  Oral Oral Oral  Resp:  16 18 20   Height:      Weight:   222 lb 10.6 oz (101 kg)   SpO2:  96% 94% 96%    Intake/Output Summary (Last 24 hours) at 12/21/13 [id number] Last data filed at 12/21/13 0300  Gross per 24 hour  Intake   1125 ml  Output      0 ml  Net   1125 ml   Filed Weights   12/19/13 2300 12/20/13 0045 12/21/13 0023  Weight: 227 lb 1.2 oz (103 kg) 221 lb 14.4 oz (100.653 kg) 222 lb 10.6 oz (101 kg)    PHYSICAL EXAM  General: Pleasant, NAD. Neuro: Alert and oriented X 3. Moves all extremities spontaneously. Psych: Normal affect. HEENT:  Normal  Neck: Supple without bruits or JVD. Lungs:  Resp regular and unlabored, CTA. Heart: RRR no s3, s4, or murmurs. Abdomen: Soft, non-tender, non-distended, BS + x 4.  Extremities: No clubbing, cyanosis or edema. DP/PT/Radials 2+ and equal bilaterally.  R wrist w/o bleeding/bruit/hematoma.  Accessory Clinical Findings  CBC  Recent Labs  12/19/13 2109 12/20/13 0313 12/21/13 0340  WBC 7.3 6.8 6.6  NEUTROABS 5.3  --   --   HGB 15.6 14.9 15.2  HCT 43.0 41.5 42.0  MCV 86.3 86.5 87.0  PLT 236 248  234   Basic Metabolic Panel  Recent Labs  12/20/13 0313 12/21/13 0340  NA 136* 138  K 3.8 4.2  CL 99 101  CO2 22 23  GLUCOSE 213* 241*  BUN 18 15  CREATININE 0.62 0.63  CALCIUM 8.6 9.0   Cardiac Enzymes  Recent Labs  12/20/13 0313 12/20/13 0845 12/20/13 1450  TROPONINI <0.30 <0.30 <0.30   Hemoglobin A1C  Recent Labs  12/20/13 0313  HGBA1C 8.9*   Fasting Lipid Panel  Recent Labs  12/20/13 0313  CHOL 157  HDL 38*  LDLCALC 88  TRIG 191154*  CHOLHDL 4.1   TELE  rsr  ECG  Rsr, 66, poor r prog.  Radiology/Studies  Dg Chest Port 1 View  12/19/2013   CLINICAL DATA:  Chest pressure.  Irregular heartbeat.  EXAM: PORTABLE CHEST - 1 VIEW  COMPARISON:  None.  FINDINGS: Cardiopericardial silhouette within normal limits. No airspace disease. No effusion. Radiopaque leads project over the chest, which may be related to a neurostimulator.  IMPRESSION: No acute cardiopulmonary disease.   Electronically Signed   By: Andreas NewportGeoffrey  Lamke M.D.   On: 12/19/2013 22:20    ASSESSMENT  AND PLAN  1.  USA/CAD:  S/p cath/PCI/DES to the proximal LAD yesterday.  No chest pain overnight.  Cardiac rehab to see this morning.  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  We discussed the importance of symptom monitoring and limiting wrist activities/weight lifting over the next week.  Cont asa, brilinta, statin, bb.  Plan d/c this AM.  2.  DM:  Poorly controlled.  A1c 8.9.  On metformin 500 bid @ home.  Would increase to 1000mg  bid upon resumption in 48 hrs.  F/U with PCP (Avbuere).  3.  HTN:  Stable.  4.  HL:  LDL 88.  Cont statin.  5.  Tob Abuse:  Cessation advised.  Signed, Nicolasa Ducking NP  Patient seen and examined. Agree with assessment and plan. Pt doing well s/p PCI yesterday. Cath site stable. Mild GI symptoms this morning. No chest pain with ambulation. DC today    [name], MD, [facility] 12/21/2013 9:32  AM

## 2013-12-21 NOTE — Progress Notes (Signed)
CARDIAC REHAB PHASE I   PRE:  Rate/Rhythm: 86 SR  BP:  Supine:   Sitting: 107/78  Standing:    SaO2:   MODE:  Ambulation: 1000 ft   POST:  Rate/Rhythm: 84 SR  BP:  Supine:   Sitting: 118/84  Standing:   SaO2:  0900-1015 Arrived at 0815 to see pt. He was c/o of chest pressure. BP 144/98. Ask pt's wife if she wanted to interpret for the pt or if she wanted an interpreter. She states that she wanted to interpret. I discussed this with pt's RN so that she could  get form signed. I also reported chest pressure. I returned at 0915. Pt now states that he feel fine and denies any pain or pressure. Pt tolerated ambulation well  without c/o. I completed stent education with pt. They voice understanding. We discussed smoking cessation. I gave them tips for quitting, quit smart class schedule and coaching contact number. Pt seems very motivated to making changes. We discussed alcohol limits.Pt agrees to Outpt. [facility] in [place], will send referral. Pt's wife was the  interpreter.   Melina CopaLisa Nada Godley RN 12/21/2013 10:22 AM

## 2013-12-21 NOTE — Discharge Summary (Signed)
Discharge Summary   Patient ID: Drema HalonSeungwoo Moline,  MRN: [id number], DOB/AGE: 1966/07/10 48 y.o.  Admit date: 12/19/2013 Discharge date: 12/21/2013  Primary Care Provider: E. Concepcion ElkAvbuere, MD Primary Cardiologist: Mendel RyderH. Smith, MD   Discharge Diagnoses Principal Problem:   Unstable angina pectoris Active Problems:   CAD (coronary artery disease)   Diabetes mellitus type II, uncontrolled   Tobacco abuse   HTN (hypertension)   Hyperlipidemia  Allergies No Known Allergies  Procedures  Cardiac Catheterization and Percutaneous Coronary Intervention 2.11.2015  HEMODYNAMICS:  Aortic pressure 90/62 mmHg; LV pressure 90/1 mm mark; LVEDP 4 mmHg  ANGIOGRAPHIC DATA:     The left main coronary artery is normal. The left anterior descending artery is eccentric proximal 90% stenosis.   **The LAD was successfully stented using a 3.0 x 16 mm Promus Premier DES.**  The left circumflex artery is widely patent and gives origin to 2 obtuse marginal branches.. The right coronary artery is totally occluded distally with both right to right and left-to-right collaterals noted. This is felt to represent a chronic total occlusion possibly many years duration.  PCI RESULTS: 90+ percent stenosis, highly eccentric in the proximal LAD reduced to 0% with TIMI grade 3 flow noted post procedure. Complications  LEFT VENTRICULOGRAM:  Left ventricular angiogram was done in the 30 RAO projection and revealed an LVEF 60% without regional wall motion abnormality. _____________   History of Present Illness  [age] year old male with a prior history of hypertension, diabetes, and ongoing tobacco abuse who was in his usual state of health until the day of admission when he developed sudden onset of chest discomfort radiating to his neck associated with palpitations, lightheadedness, and diaphoresis. Symptoms were intermittent throughout the day prompting presentation to a local urgent care where ECG showed J-point elevation in  leads V2 and V3. [facility] where initial point-of-care troponin was mildly elevated at 0.09.  ECG was not felt to represent ST elevation. Patient was placed on heparin and admitted for further evaluation.  Hospital Course  Following admission, patient ruled out for myocardial infarction by all subsequent troponins. He had no further chest pain. He was maintained on aspirin, beta blocker, heparin, and statin therapy and decision was made to pursue diagnostic catheterization. Catheterization was performed on February 11, revealing a chronic totally occluded right coronary artery and severe proximal LAD disease. The LAD was felt to be the culprit vessel and this was successfully treated using a 3.0 by 16mm Promus Premier drug-eluting stent. Patient tolerated procedure well and post procedure has been ambulating without recurrent limitations. He did have some mild indigestion this morning which was successfully treated with Mylanta. He will be discharged home today in good condition and followup has been arranged for one week.  Of note, he has been hyperglycemic during admission and his hemoglobin A1c is found to be 8.9. We have increased his home metformin dose to 1000 mg twice a day and in the setting of recent contrast, have recommended that he hold off on beginning this until February 13.  Discharge Vitals Blood pressure 144/98, pulse 77, temperature 98.1 F (36.7 C), temperature source Oral, resp. rate 18, height 6\' 1"  (1.854 m), weight 222 lb 10.6 oz (101 kg), SpO2 96.00%.  Filed Weights   12/19/13 2300 12/20/13 0045 12/21/13 0023  Weight: 227 lb 1.2 oz (103 kg) 221 lb 14.4 oz (100.653 kg) 222 lb 10.6 oz (101 kg)   Labs  CBC  Recent Labs  12/19/13 2109 12/20/13  1610 12/21/13 0340  WBC 7.3 6.8 6.6  NEUTROABS 5.3  --   --   HGB 15.6 14.9 15.2  HCT 43.0 41.5 42.0  MCV 86.3 86.5 87.0  PLT 236 248 234   Basic Metabolic Panel  Recent Labs  12/20/13 0313  12/21/13 0340  NA 136* 138  K 3.8 4.2  CL 99 101  CO2 22 23  GLUCOSE 213* 241*  BUN 18 15  CREATININE 0.62 0.63  CALCIUM 8.6 9.0   Cardiac Enzymes  Recent Labs  12/20/13 0313 12/20/13 0845 12/20/13 1450  TROPONINI <0.30 <0.30 <0.30   Hemoglobin A1C  Recent Labs  12/20/13 0313  HGBA1C 8.9*   Fasting Lipid Panel  Recent Labs  12/20/13 0313  CHOL 157  HDL 38*  LDLCALC 88  TRIG 960*  CHOLHDL 4.1   Disposition  Pt is being discharged home today in good condition.  Follow-up Plans & Appointments  Follow-up Information   Follow up with Nicolasa Ducking, NP On 12/28/2013. (12:00 - Dr. Michaelle Copas Nurse Practitioner)    Specialty:  Nurse Practitioner   Contact information:   [address] [place] [address] [phone]      Discharge Medications    Medication List         aspirin 81 MG chewable tablet  Chew 1 tablet (81 mg total) by mouth daily.     atorvastatin 40 MG tablet  Commonly known as:  LIPITOR  Take 1 tablet (40 mg total) by mouth daily at 6 PM.     lisinopril 20 MG tablet  Commonly known as:  PRINIVIL,ZESTRIL  Take 20 mg by mouth 2 (two) times daily.     metFORMIN 1000 MG tablet  Commonly known as:  GLUCOPHAGE  Take 1 tablet (1,000 mg total) by mouth 2 (two) times daily with a meal.     metoprolol tartrate 25 MG tablet  Commonly known as:  LOPRESSOR  Take 0.5 tablets (12.5 mg total) by mouth 2 (two) times daily.     nitroGLYCERIN 0.4 MG SL tablet  Commonly known as:  NITROSTAT  Place 1 tablet (0.4 mg total) under the tongue every 5 (five) minutes x 3 doses as needed for chest pain.     Ticagrelor 90 MG Tabs tablet  Commonly known as:  BRILINTA  Take 1 tablet (90 mg total) by mouth 2 (two) times daily.       Outstanding Labs/Studies  Follow-up lipids/lft's in 8 wks.  Duration of Discharge Encounter   Greater than 30 minutes including physician time.  Signed, Nicolasa Ducking NP 12/21/2013, 9:43  AM

## 2013-12-21 NOTE — Progress Notes (Signed)
[id number] Pt c/o chestp pressure  3/10  Scale, denies pain, noted burping, also c/o of slight nausea, no vomitting noted,  Pt"s wife stated "he  takes zantac  Everyday" ntg sl given, b/p 144/98/ HR 79 SR. Marvis Represshris Berg NP notified, maalox given  With relief.

## 2013-12-21 NOTE — Discharge Instructions (Signed)

## 2013-12-28 ENCOUNTER — Telehealth: Payer: Self-pay | Admitting: Interventional Cardiology

## 2013-12-28 ENCOUNTER — Ambulatory Visit (INDEPENDENT_AMBULATORY_CARE_PROVIDER_SITE_OTHER): Payer: BC Managed Care – PPO | Admitting: Nurse Practitioner

## 2013-12-28 ENCOUNTER — Encounter: Payer: Self-pay | Admitting: Nurse Practitioner

## 2013-12-28 VITALS — BP 126/83 | HR 62 | Ht 72.0 in | Wt 226.0 lb

## 2013-12-28 DIAGNOSIS — F17200 Nicotine dependence, unspecified, uncomplicated: Secondary | ICD-10-CM

## 2013-12-28 DIAGNOSIS — I2510 Atherosclerotic heart disease of native coronary artery without angina pectoris: Secondary | ICD-10-CM

## 2013-12-28 DIAGNOSIS — I10 Essential (primary) hypertension: Secondary | ICD-10-CM

## 2013-12-28 DIAGNOSIS — IMO0002 Reserved for concepts with insufficient information to code with codable children: Secondary | ICD-10-CM

## 2013-12-28 DIAGNOSIS — IMO0001 Reserved for inherently not codable concepts without codable children: Secondary | ICD-10-CM

## 2013-12-28 DIAGNOSIS — Z720 Tobacco use: Secondary | ICD-10-CM

## 2013-12-28 DIAGNOSIS — E1165 Type 2 diabetes mellitus with hyperglycemia: Secondary | ICD-10-CM

## 2013-12-28 DIAGNOSIS — E785 Hyperlipidemia, unspecified: Secondary | ICD-10-CM

## 2013-12-28 NOTE — Telephone Encounter (Signed)
New message  Pt wife called states that he was asked during his OV was he experiencing SOB// She states that at the time he was sitting down but he does experience SOB with exertion// Pt is requesting a call back to discuss

## 2013-12-28 NOTE — Telephone Encounter (Signed)
2nd attempt to call pt.lmom on all pt phone #s listed

## 2013-12-28 NOTE — Progress Notes (Signed)
Patient Name: Tristan Camacho Date of Encounter: 12/28/2013  Primary Care Provider:  E. Concepcion Elk, MD Primary Cardiologist:  Mendel Ryder, MD   Patient Profile  48 y/o male with recent admission for [place] with finding of sev LAD dzs req PCI/DES.  Problem List   Past Medical History  Diagnosis Date  . Diabetes mellitus without complication   . GERD (gastroesophageal reflux disease)   . Hypertension   . CAD (coronary artery disease)     a. 12/2013 Cath/PCI: LM nl, LAD 90p (3.0x16 Promus Premier DES), LCX nl, RCA 100, r->r, l->r collats, EF 60% w/o rwma.  . Hyperlipidemia   . Tobacco abuse    Past Surgical History  Procedure Laterality Date  . Shoulder surgery Right     Allergies  No Known Allergies  HPI  48 y.o. Asian male 50 days post stenting of LAD with >90% occlusion.   Presented to [facility] on 2/10 with chest discomfort and subsequently ruled out.  Decision was made to pursue cath and this revealed severe LAD dzs with otw nonobs dzs and nl LV fxn.  He underwent successful PCI/DES to the LAD and was d/c'd on 2/12 on asa, brilinta, bb, acei, and statin therapy.  Since d/c, he has been doing well w/o chest pain or dyspnea.  He has quit smoking and now often feels anxious when he smells cigarette smoke.  He has been slowly increasing his activity at home and is eager to get back into the gym.  He is drinking ~ 40 oz of 20% etoh/wk - has done this for a long time.  He denies chest pain, palpitations, dyspnea, pnd, orthopnea, n, v, dizziness, syncope, edema, weight gain, or early satiety.   Home Medications  Prior to Admission medications   Medication Sig Start Date End Date Taking? Authorizing Provider  aspirin 81 MG chewable tablet Chew 1 tablet (81 mg total) by mouth daily. 12/21/13  Yes Ok Anis, NP  atorvastatin (LIPITOR) 40 MG tablet Take 1 tablet (40 mg total) by mouth daily at 6 PM. 12/21/13  Yes Ok Anis, NP  lisinopril (PRINIVIL,ZESTRIL) 20 MG tablet Take 20 mg  by mouth 2 (two) times daily.    Yes Historical Provider, MD  metFORMIN (GLUCOPHAGE) 1000 MG tablet Take 1 tablet (1,000 mg total) by mouth 2 (two) times daily with a meal. 12/21/13  Yes Ok Anis, NP  metoprolol tartrate (LOPRESSOR) 25 MG tablet Take 0.5 tablets (12.5 mg total) by mouth 2 (two) times daily. 12/21/13  Yes Ok Anis, NP  nitroGLYCERIN (NITROSTAT) 0.4 MG SL tablet Place 1 tablet (0.4 mg total) under the tongue every 5 (five) minutes x 3 doses as needed for chest pain. 12/21/13  Yes Ok Anis, NP  Ticagrelor (BRILINTA) 90 MG TABS tablet Take 1 tablet (90 mg total) by mouth 2 (two) times daily. 12/21/13  Yes Ok Anis, NP   Review of Systems  He denies chest pain, palpitations, dyspnea, pnd, orthopnea, n, v, dizziness, syncope, edema, weight gain, or early satiety.  All other systems reviewed and are otherwise negative except as noted above.  Physical Exam  Blood pressure 126/83, pulse 62, height 6' (1.829 m), weight 226 lb (102.513 kg).  General: Pleasant, NAD Psych: Normal affect. Neuro: Alert and oriented X 3. Moves all extremities spontaneously. HEENT: [place], AT.  Sclarea non-icteric. Conjunctiva non-injected. Dark purple ecchymosis staring from left inner canthus to left nasal border and orbital. Left orbital stable. No thyromegaly.  Neck: Supple without bruits or JVD. Carotid upstroke 2+. Lungs:  Resp regular and unlabored, CTA. Diminished.  Heart: RRR no s3, s4, or murmurs. Cap refill <3 sec.  Abdomen: Soft, non-tender, non-distended, BS + x 4.  Extremities: No clubbing, cyanosis or edema. DP/PT/Radials 2+ and equal bilaterally.  R wrist cath site w/o bleeding/bruit/hematoma.  Accessory Clinical Findings  ECG - rsr, 62, lad, lafb, slight st elev in aVL - unchanged.  Assessment & Plan  1.  CAD:  S/p PCI/DES to LAD 12/20/2013.  Doing well w/o recurrent chest pain or dyspnea.  Tolerating meds well and has been compliant.  He'd like to  get back into the gym.  Recommendation has been made to not over exert to the point that a conversation is not  possible and to continue to slowly work his way up to his previous level of activity. Continue current BB, ASA, statin and ACE therapy.    2. HTN: currently well controlled.  Cont bb/acei.  3. HL: LDL 88, Trig 154, HDL 38 on check last week.  Continue statin therapy. Follow-up blood work in 6 wks.   4. Tobacco abuse: Cessation on 12/20/2013. Not smoked since intervention. No plans to start back   5. DM, II: to be followed by PCP.  Metformin was increased to 1000mg  bid while hospitalized.  A1c was 8.9.  6.  ETOH Use:  Drinking about 6oz of 20% etoh/day.   Patient and wife educated to limiting alcohol intake to current guidelines.  7.  Dispo: f/u lipids/lft's in 6 wks.  Follow-up with Dr. Katrinka BlazingSmith in 3 mos or sooner if necessary.   Nicolasa Duckinghristopher Suleika Donavan, NP 12/28/2013, 9:44 AM

## 2013-12-28 NOTE — Telephone Encounter (Signed)
returned pt wife call.lmom for pt to call back

## 2013-12-28 NOTE — Patient Instructions (Signed)
Your physician recommends that you return for lab work in: 6 weeks for fasting lab work: lipid, lfts Remember not to eat or drink after midnight except for water or black coffee before coming in for your fasting lab work  Your physician recommends that you continue on your current medications as directed. Please refer to the Current Medication list given to you today.  Your physician recommends that you schedule a follow-up appointment in: 3 months with Dr. Katrinka BlazingSmith

## 2014-01-24 ENCOUNTER — Telehealth (HOSPITAL_COMMUNITY): Payer: Self-pay | Admitting: *Deleted

## 2014-01-24 NOTE — Telephone Encounter (Signed)
Spoke with pt on Monday March 16. Called and spoke with the refill team at Dr. Katrinka BlazingSmith office.  Brillinta samples will be made available for pt wife to pick up.  Pt wife called and informed that samples will need to be picked up asap and not delay miss taking the medication.  Advised pt it is very important to take this every day as prescribed by Dr. Katrinka BlazingSmith.  In basket message sent to Dr. Katrinka BlazingSmith and his CMA to inform of the medication issue and the need for a long term solution.

## 2014-01-25 ENCOUNTER — Encounter (HOSPITAL_COMMUNITY)
Admission: RE | Admit: 2014-01-25 | Discharge: 2014-01-25 | Disposition: A | Discharge status: Home / Self Care | Payer: BC Managed Care – PPO | Source: Ambulatory Visit | Attending: Interventional Cardiology | Admitting: Interventional Cardiology

## 2014-01-25 ENCOUNTER — Encounter: Payer: Self-pay | Admitting: Interventional Cardiology

## 2014-01-25 ENCOUNTER — Ambulatory Visit (INDEPENDENT_AMBULATORY_CARE_PROVIDER_SITE_OTHER): Payer: BC Managed Care – PPO | Admitting: Interventional Cardiology

## 2014-01-25 VITALS — BP 116/77 | HR 79 | Ht 72.0 in | Wt 228.1 lb

## 2014-01-25 DIAGNOSIS — I5032 Chronic diastolic (congestive) heart failure: Secondary | ICD-10-CM | POA: Insufficient documentation

## 2014-01-25 DIAGNOSIS — Z5189 Encounter for other specified aftercare: Secondary | ICD-10-CM | POA: Insufficient documentation

## 2014-01-25 DIAGNOSIS — Z720 Tobacco use: Secondary | ICD-10-CM

## 2014-01-25 DIAGNOSIS — I10 Essential (primary) hypertension: Secondary | ICD-10-CM

## 2014-01-25 DIAGNOSIS — Z9861 Coronary angioplasty status: Secondary | ICD-10-CM | POA: Insufficient documentation

## 2014-01-25 DIAGNOSIS — I2510 Atherosclerotic heart disease of native coronary artery without angina pectoris: Secondary | ICD-10-CM

## 2014-01-25 DIAGNOSIS — E785 Hyperlipidemia, unspecified: Secondary | ICD-10-CM

## 2014-01-25 DIAGNOSIS — F17200 Nicotine dependence, unspecified, uncomplicated: Secondary | ICD-10-CM

## 2014-01-25 MED ORDER — CLOPIDOGREL BISULFATE 75 MG PO TABS: 75.0000 mg | ORAL_TABLET | Freq: Every day | ORAL | Status: DC

## 2014-01-25 MED ORDER — LISINOPRIL-HYDROCHLOROTHIAZIDE 20-12.5 MG PO TABS: 1.0000 | ORAL_TABLET | Freq: Every day | ORAL | Status: DC

## 2014-01-25 MED ORDER — ASPIRIN 325 MG PO TABS: 325.0000 mg | ORAL_TABLET | Freq: Every day | ORAL | Status: DC

## 2014-01-25 NOTE — Patient Instructions (Addendum)
Your physician has recommended you make the following change in your medication:  1)START Clopidogrel (Plavix) 75mg  daily.an Rx has been sent to your pharmacy 2)START Aspirin 325mg  daily 3)START Lisinipril HCTZ 20-12.5mg  4)STOP Lisinopril  Your physician recommends that you schedule a follow-up appointment with a Dermatologist  You have follow up appt scheduled for 03/19/14 @9  am

## 2014-01-25 NOTE — Progress Notes (Signed)
Cardiac Rehab Medication Review by a Pharmacist  Does the patient  feel that his/her medications are working for him/her?  yes  Has the patient been experiencing any side effects to the medications prescribed?  Yes (Minor bruises, cuts that last longer than usual - d/t antiplatelet therapy)  Does the patient measure his/her own blood pressure or blood glucose at home?  no   Does the patient have any problems obtaining medications due to transportation or finances?   no  Understanding of regimen: good Understanding of indications: good Potential of compliance: good    Pharmacist comments:   Mr. Tristan Camacho is a 48yo [place] male who is accompanied by his wife this morning. I have updated all medications and allergies. Patient presents with significant redness on face around the nose that is reported to have gotten worse after the stent was placed. However, denies systemic rashes or hives. I do not think this is a drug allergy. Patient has an appt today with Dr. Katrinka BlazingSmith. However, I would recommend continued reinforcement of alcohol intake reduction in this patient as it may exacerbate his condition/interact with the metabolism of his medications.   Tyrone NineAlvin B. Artelia Larocheung, PharmD Clinical Pharmacist - Resident Phone: [phone] Pager: [phone] 01/25/2014 9:24 AM

## 2014-01-25 NOTE — Progress Notes (Signed)
Patient ID: Tristan Camacho, male   DOB: Apr 07, 1966, 48 y.o.   MRN: [id number]    [address] [place], [place]  [address] Phone: [phone] Fax:  [phone]  Date:  01/25/2014   ID:  Tristan Camacho, DOB Apr 07, 1966, MRN [id number]  PCP:  No PCP Per Patient   ASSESSMENT:  1. Coronary atherosclerotic heart disease with chronically occluded right coronary and recently stented LAD 2. Exertional dyspnea, likely related to diastolic heart failure 3. Hypertension 4. Hyperlipidemia 5. Diabetes   PLAN:  1. Change lisinopril 40 mg per day to lisinopril HCTZ 20/12.5 mg per day 2. Clinical followup in 6-8 weeks 3. Change Brilinta to clopidogrel 75 mg per day along with aspirin 325 mg per day 4. Dermatologist for facial rash 5. Call if other clinical problems prior to the return appointment   SUBJECTIVE: Tristan Camacho is a 48 y.o. male who has multiple complaints including exertional dyspnea. No chest pain. He has not had syncope. He feels fatigued and weak all the time according to his wife. No peripheral edema. No orthopnea. There is a malar rash and also involves the 4 head that appears to be acne like. I do not believe this is a drug reaction.   Wt Readings from Last 3 Encounters:  01/25/14 228 lb 1.9 oz (103.475 kg)  01/25/14 229 lb 11.5 oz (104.2 kg)  12/28/13 226 lb (102.513 kg)     Past Medical History  Diagnosis Date  . Diabetes mellitus without complication   . GERD (gastroesophageal reflux disease)   . Hypertension   . CAD (coronary artery disease)     a. 12/2013 Cath/PCI: LM nl, LAD 90p (3.0x16 Promus Premier DES), LCX nl, RCA 100, r->r, l->r collats, EF 60% w/o rwma.  . Hyperlipidemia   . Tobacco abuse     Current Outpatient Prescriptions  Medication Sig Dispense Refill  . aspirin 81 MG chewable tablet Chew 1 tablet (81 mg total) by mouth daily.      Marland Kitchen. atorvastatin (LIPITOR) 40 MG tablet Take 1 tablet (40 mg total) by mouth daily at 6 PM.  30 tablet  6  .  lisinopril (PRINIVIL,ZESTRIL) 20 MG tablet Take 20 mg by mouth 2 (two) times daily.       . metFORMIN (GLUCOPHAGE) 1000 MG tablet Take 1 tablet (1,000 mg total) by mouth 2 (two) times daily with a meal.  60 tablet  3  . metoprolol tartrate (LOPRESSOR) 25 MG tablet Take 0.5 tablets (12.5 mg total) by mouth 2 (two) times daily.  30 tablet  6  . nitroGLYCERIN (NITROSTAT) 0.4 MG SL tablet Place 1 tablet (0.4 mg total) under the tongue every 5 (five) minutes x 3 doses as needed for chest pain.  25 tablet  3  . Ticagrelor (BRILINTA) 90 MG TABS tablet Take 1 tablet (90 mg total) by mouth 2 (two) times daily.  60 tablet  6   No current facility-administered medications for this visit.    Allergies:   No Known Allergies  Social History:  The patient  reports that he has quit smoking. He does not have any smokeless tobacco history on file.   ROS:  Please see the history of present illness.   Multiple concerns that do not reflect cardiovascular problems   All other systems reviewed and negative.   OBJECTIVE: VS:  BP 116/77  Pulse 79  Ht 6' (1.829 m)  Wt 228 lb 1.9 oz (103.475 kg)  BMI 30.93 kg/m2 Well nourished, well  developed, in no acute distress, appears depressed  HEENT: normal Neck: JVD flat. Carotid bruit absent  Cardiac:  normal S1, S2; RRR; no murmur Lungs:  clear to auscultation bilaterally, no wheezing, rhonchi or rales Abd: soft, nontender, no hepatomegaly Ext: Edema absent. Pulses 2+ and symmetric  Skin: warm and dry Neuro:  CNs 2-12 intact, no focal abnormalities noted  EKG:  Normal sinus rhythm with left anterior hemiblock.       Signed, Darci Needle III, MD 01/25/2014 4:53 PM

## 2014-01-29 ENCOUNTER — Encounter (HOSPITAL_COMMUNITY)
Admission: RE | Admit: 2014-01-29 | Discharge: 2014-01-29 | Disposition: A | Discharge status: Home / Self Care | Payer: BC Managed Care – PPO | Source: Ambulatory Visit | Attending: Interventional Cardiology | Admitting: Interventional Cardiology

## 2014-01-29 LAB — GLUCOSE, CAPILLARY
GLUCOSE-CAPILLARY: 168 mg/dL — AB (ref 70–99)
Glucose-Capillary: 217 mg/dL — ABNORMAL HIGH (ref 70–99)

## 2014-01-29 NOTE — Progress Notes (Signed)
Pt started cardiac rehab today.  Pt tolerated light exercise without difficulty. Telemetry rhythm Sinus rhythm downward QRS. Vital signs stable. Mrs Shari HeritageSon says Michelle PiperSeungwoo is taking samples of Brillinta twice a day then will start taking plavix once a day due to cost of Brillinta. I will check with Dr Michaelle CopasSmith's office to make sure this is correct. Will continue to monitor the patient throughout  the program.

## 2014-01-31 ENCOUNTER — Encounter (HOSPITAL_COMMUNITY)
Admission: RE | Admit: 2014-01-31 | Discharge: 2014-01-31 | Disposition: A | Discharge status: Home / Self Care | Payer: BC Managed Care – PPO | Source: Ambulatory Visit | Attending: Interventional Cardiology | Admitting: Interventional Cardiology

## 2014-01-31 LAB — GLUCOSE, CAPILLARY
GLUCOSE-CAPILLARY: 229 mg/dL — AB (ref 70–99)
Glucose-Capillary: 273 mg/dL — ABNORMAL HIGH (ref 70–99)

## 2014-02-01 NOTE — Progress Notes (Signed)
Lopes's blood pressure was noted at 176/96 yesterday during bike testing. Patient asymptomatic. Subsequent blood pressures improved. Nada BoozerLaura Ingold FNP-C called and notified. Will fax exercise flow sheets to Dr. Michaelle CopasSmith's office for review. Exit blood pressure 114/72 yesterday.Will continue to monitor the patient throughout  the program.

## 2014-02-05 ENCOUNTER — Telehealth (HOSPITAL_COMMUNITY): Payer: Self-pay | Admitting: *Deleted

## 2014-02-05 ENCOUNTER — Encounter (HOSPITAL_COMMUNITY): Admission: RE | Admit: 2014-02-05 | Payer: BC Managed Care – PPO | Source: Ambulatory Visit

## 2014-02-07 ENCOUNTER — Encounter (HOSPITAL_COMMUNITY)
Admission: RE | Admit: 2014-02-07 | Discharge: 2014-02-07 | Disposition: A | Discharge status: Home / Self Care | Payer: BC Managed Care – PPO | Source: Ambulatory Visit | Attending: Interventional Cardiology | Admitting: Interventional Cardiology

## 2014-02-07 DIAGNOSIS — Z9861 Coronary angioplasty status: Secondary | ICD-10-CM | POA: Insufficient documentation

## 2014-02-07 DIAGNOSIS — Z5189 Encounter for other specified aftercare: Secondary | ICD-10-CM | POA: Insufficient documentation

## 2014-02-07 LAB — GLUCOSE, CAPILLARY
Glucose-Capillary: 406 mg/dL — ABNORMAL HIGH (ref 70–99)
Glucose-Capillary: 414 mg/dL — ABNORMAL HIGH (ref 70–99)

## 2014-02-07 NOTE — Progress Notes (Signed)
Nutrition Note Spoke with pt. Pt's pre-exercise CBG 406 mg/dL. Pt states he feels tired, which is likely due to hyperglycemia. Pt's wife reports they did play poker earlier this week and pt only go "3-4 hours of sleep that night." Pt's wife interpreted information for pt. Pt states he ate 2 bowls of rice with Kimchi for lunch. Pt ate 1 bowl of rice for breakfast. Pt's wife states pt checks his CBG's "1-2 times a week because we are lazy." Rational for checking CBG's frequently and obtaining tighter blood glucose control discussed. Pt instructed to check CBG's prior to coming to exercise. Tristan LighterMaria Whitaker, Tristan Camacho, called Dr. Trula SladeAvubere's office re: hyperglycemia. Suspect pt may benefit from an additional anti-diabetic medication. Pt's wife reports pt has a history of "not following the (DM) diet and eating whatever he wants." Pt's wife feels pt is doing "a little better." The benefit of tight glycemic control for pt's heart disease discussed. Continue client-centered nutrition education by Tristan Camacho as part of interdisciplinary care.  Monitor and evaluate progress toward nutrition goal with team.  Tristan Camacho, Tristan Camacho, Tristan Camacho, Tristan Camacho, Tristan Camacho 02/07/2014 3:43 PM

## 2014-02-07 NOTE — Progress Notes (Signed)
CBG 406 this afternoon.  Urine negative for ketones. No exercise today per protocol. Dr Avbuere's office called and notified. Patient was counseled by the dietitian.  Mr Tristan Camacho said he took his medications as prescribed but did not check his blood sugar at home today. Patient instructed to check his blood sugar when he returns to exercise on Monday. Patient told not to come to cardiac rehab if his blood sugar is greater than 300. Patient states understanding.

## 2014-02-08 ENCOUNTER — Ambulatory Visit (INDEPENDENT_AMBULATORY_CARE_PROVIDER_SITE_OTHER): Payer: BC Managed Care – PPO | Admitting: Family Medicine

## 2014-02-08 ENCOUNTER — Other Ambulatory Visit (INDEPENDENT_AMBULATORY_CARE_PROVIDER_SITE_OTHER): Payer: BC Managed Care – PPO

## 2014-02-08 ENCOUNTER — Telehealth: Payer: Self-pay

## 2014-02-08 VITALS — BP 108/68 | HR 86 | Temp 98.0°F | Resp 16 | Ht 72.5 in | Wt 226.8 lb

## 2014-02-08 DIAGNOSIS — IMO0001 Reserved for inherently not codable concepts without codable children: Secondary | ICD-10-CM

## 2014-02-08 DIAGNOSIS — E1165 Type 2 diabetes mellitus with hyperglycemia: Principal | ICD-10-CM

## 2014-02-08 DIAGNOSIS — E785 Hyperlipidemia, unspecified: Secondary | ICD-10-CM

## 2014-02-08 DIAGNOSIS — I2510 Atherosclerotic heart disease of native coronary artery without angina pectoris: Secondary | ICD-10-CM

## 2014-02-08 LAB — POCT CBC
Granulocyte percent: 66.4 %G (ref 37–80)
HCT, POC: 44 % (ref 43.5–53.7)
HEMOGLOBIN: 14.2 g/dL (ref 14.1–18.1)
LYMPH, POC: 1.6 (ref 0.6–3.4)
MCH, POC: 30.1 pg (ref 27–31.2)
MCHC: 32.3 g/dL (ref 31.8–35.4)
MCV: 93.3 fL (ref 80–97)
MID (cbc): 0.4 (ref 0–0.9)
MPV: 7.6 fL (ref 0–99.8)
POC Granulocyte: 4 (ref 2–6.9)
POC LYMPH %: 26.4 % (ref 10–50)
POC MID %: 7.2 %M (ref 0–12)
Platelet Count, POC: 296 10*3/uL (ref 142–424)
RBC: 4.72 M/uL (ref 4.69–6.13)
RDW, POC: 13.2 %
WBC: 6 10*3/uL (ref 4.6–10.2)

## 2014-02-08 LAB — LIPID PANEL
CHOLESTEROL: 98 mg/dL (ref 0–200)
HDL: 32.9 mg/dL — AB (ref >39.00)
LDL CALC: 49 mg/dL (ref 0–99)
Total CHOL/HDL Ratio: 3
Triglycerides: 81 mg/dL (ref 0.0–149.0)
VLDL: 16.2 mg/dL (ref 0.0–40.0)

## 2014-02-08 LAB — POCT URINALYSIS DIPSTICK
BILIRUBIN UA: NEGATIVE
Blood, UA: NEGATIVE
Glucose, UA: 500
Leukocytes, UA: NEGATIVE
NITRITE UA: NEGATIVE
PH UA: 5
Protein, UA: NEGATIVE
Urobilinogen, UA: 1

## 2014-02-08 LAB — HEPATIC FUNCTION PANEL
ALT: 24 U/L (ref 0–53)
AST: 16 U/L (ref 0–37)
Albumin: 3.8 g/dL (ref 3.5–5.2)
Alkaline Phosphatase: 66 U/L (ref 39–117)
BILIRUBIN DIRECT: 0.1 mg/dL (ref 0.0–0.3)
Total Bilirubin: 1.3 mg/dL — ABNORMAL HIGH (ref 0.3–1.2)
Total Protein: 6.9 g/dL (ref 6.0–8.3)

## 2014-02-08 LAB — GLUCOSE, POCT (MANUAL RESULT ENTRY): POC Glucose: 248 mg/dl — AB (ref 70–99)

## 2014-02-08 MED ORDER — GLIPIZIDE ER 5 MG PO TB24: 5.0000 mg | ORAL_TABLET | Freq: Every day | ORAL | Status: DC

## 2014-02-08 NOTE — Telephone Encounter (Signed)
Message copied by Jarvis NewcomerPARRIS-GODLEY, Elvera Almario S on Thu Feb 08, 2014 10:07 AM ------      Message from: Verdis PrimeSMITH, HENRY      Created: Thu Feb 08, 2014  9:28 AM       Tell him that DM is out of control and he needs to go to PCP ASAP ------

## 2014-02-08 NOTE — Progress Notes (Signed)
Patient ID: Tristan Camacho, male   DOB: 30-Jul-1966, 48 y.o.   MRN: [id number]   Subjective:  This chart was scribed for Tristan Chick, MD by Beverly Milch, ED Scribe. This patient was seen in room 12 and the patient's care was started at 9:27 PM.   Patient ID: Tristan Camacho, male    DOB: 1966/05/15, 48 y.o.   MRN: [id number]  02/08/2014  Hyperglycemia   HPI HPI Comments: Tristan Camacho is a 48 y.o. male with a h/o DM for the past 10 years, HTN, CAD, and HLD who presents to the [facility] complaining of raised blood sugar. Pt's relative states he went to Dr. Katrinka Blazing, his cardiologist, this morning. The pt's relative reports that his blood sugar rose to 404 yesterday and then 414 and hour after that check. Pt's relative states that he didn't eat last night or this morning and that his blood sugar was around 200 and rose throughout the day. Pt's relative reports that he usually manages his blood sugar with exercise and that he is on metformin. He states he has a HA, feels tired most morning, that food has become tasteless, and that he hasn't been getting up to pee very much in the evening. Pt's relative states he has had SOB and chest tightness since the surgery. Pt denies rhinorrhea, congestion, fever, chills, nausea, vomiting, diarrhea, increased appetite, eating soda or sugary food.   Review of Systems  Constitutional: Positive for appetite change (decreased) and fatigue. Negative for fever, chills and diaphoresis.  HENT: Negative for congestion, ear pain, rhinorrhea and sore throat.   Respiratory: Positive for chest tightness and shortness of breath. Negative for cough.   Gastrointestinal: Negative for nausea, vomiting, abdominal pain and diarrhea.  Endocrine: Negative for cold intolerance, heat intolerance, polydipsia, polyphagia and polyuria.  Musculoskeletal: Negative for back pain.  Skin: Negative for color change, pallor, rash and wound.  Neurological: Positive for  headaches. Negative for dizziness, tremors, seizures, syncope, facial asymmetry, speech difficulty, weakness, light-headedness and numbness.    Past Medical History  Diagnosis Date  . Diabetes mellitus without complication   . GERD (gastroesophageal reflux disease)   . Hypertension   . CAD (coronary artery disease)     a. 12/2013 Cath/PCI: LM nl, LAD 90p (3.0x16 Promus Premier DES), LCX nl, RCA 100, r->r, l->r collats, EF 60% w/o rwma.  . Hyperlipidemia   . Tobacco abuse    Past Surgical History  Procedure Laterality Date  . Shoulder surgery Right   . Coronary stent placement      No Known Allergies Current Outpatient Prescriptions  Medication Sig Dispense Refill  . nitroGLYCERIN (NITROSTAT) 0.4 MG SL tablet Place 1 tablet (0.4 mg total) under the tongue every 5 (five) minutes x 3 doses as needed for chest pain. 25 tablet 3  . aspirin EC 81 MG tablet Take 1 tablet (81 mg total) by mouth daily.    Marland Kitchen atorvastatin (LIPITOR) 40 MG tablet Take 1 tablet (40 mg total) by mouth daily at 6 PM. 90 tablet 3  . Canagliflozin (INVOKANA) 300 MG TABS Take 1 tablet (300 mg total) by mouth daily. 90 tablet 3  . clopidogrel (PLAVIX) 75 MG tablet Take 1 tablet (75 mg total) by mouth daily. 90 tablet 3  . lisinopril-hydrochlorothiazide (PRINZIDE) 20-12.5 MG per tablet Take 1 tablet by mouth daily. 90 tablet 3  . metFORMIN (GLUCOPHAGE) 1000 MG tablet Take 1 tablet (1,000 mg total) by mouth 2 (two) times daily with a  meal. 180 tablet 3  . metoprolol tartrate (LOPRESSOR) 25 MG tablet Take 0.5 tablets (12.5 mg total) by mouth 2 (two) times daily. 180 tablet 3   No current facility-administered medications for this visit.       Objective:    Triage Vitals: BP 108/68  Pulse 86  Temp(Src) 98 F (36.7 C) (Oral)  Resp 16  Ht 6' 0.5" (1.842 m)  Wt 226 lb 12.8 oz (102.876 kg)  BMI 30.32 kg/m2  SpO2 97%  Physical Exam  Constitutional: He is oriented to person, place, and time. He appears  well-developed and well-nourished. No distress.  HENT:  Head: Normocephalic and atraumatic.  Right Ear: External ear normal.  Left Ear: External ear normal.  Nose: Nose normal.  Mouth/Throat: Oropharynx is clear and moist.  Eyes: Conjunctivae and EOM are normal. Pupils are equal, round, and reactive to light.  Neck: Normal range of motion. Neck supple. Carotid bruit is not present. No thyromegaly present.  Cardiovascular: Normal rate, regular rhythm, normal heart sounds and intact distal pulses.  Exam reveals no gallop and no friction rub.   No murmur heard. Pulmonary/Chest: Effort normal and breath sounds normal. No respiratory distress. He has no wheezes. He has no rales.  Abdominal: Soft. Bowel sounds are normal. He exhibits no distension and no mass. There is tenderness. There is no rebound and no guarding.  Lymphadenopathy:    He has no cervical adenopathy.  Neurological: He is alert and oriented to person, place, and time. No cranial nerve deficit.  Skin: Skin is warm and dry. No rash noted. He is not diaphoretic.  Psychiatric: He has a normal mood and affect. His behavior is normal.  Nursing note and vitals reviewed.  Results for orders placed or performed in visit on 02/08/14  Comprehensive metabolic panel  Result Value Ref Range   Sodium 137 135 - 145 mEq/L   Potassium 4.0 3.5 - 5.3 mEq/L   Chloride 102 96 - 112 mEq/L   CO2 26 19 - 32 mEq/L   Glucose, Bld 231 (H) 70 - 99 mg/dL   BUN 19 6 - 23 mg/dL   Creat 9.60 4.54 - 0.98 mg/dL   Total Bilirubin 1.0 0.2 - 1.2 mg/dL   Alkaline Phosphatase 71 39 - 117 U/L   AST 13 0 - 37 U/L   ALT 18 0 - 53 U/L   Total Protein 7.1 6.0 - 8.3 g/dL   Albumin 4.3 3.5 - 5.2 g/dL   Calcium 9.4 8.4 - 11.9 mg/dL  POCT CBC  Result Value Ref Range   WBC 6.0 4.6 - 10.2 K/uL   Lymph, poc 1.6 0.6 - 3.4   POC LYMPH PERCENT 26.4 10 - 50 %L   MID (cbc) 0.4 0 - 0.9   POC MID % 7.2 0 - 12 %M   POC Granulocyte 4.0 2 - 6.9   Granulocyte percent 66.4  37 - 80 %G   RBC 4.72 4.69 - 6.13 M/uL   Hemoglobin 14.2 14.1 - 18.1 g/dL   HCT, POC 14.7 82.9 - 53.7 %   MCV 93.3 80 - 97 fL   MCH, POC 30.1 27 - 31.2 pg   MCHC 32.3 31.8 - 35.4 g/dL   RDW, POC 56.2 %   Platelet Count, POC 296 142 - 424 K/uL   MPV 7.6 0 - 99.8 fL  POCT glucose (manual entry)  Result Value Ref Range   POC Glucose 248 (A) 70 - 99 mg/dl  POCT urinalysis dipstick  Result Value Ref Range   Color, UA yellow    Clarity, UA clear    Glucose, UA 500    Bilirubin, UA neg    Ketones, UA trace    Spec Grav, UA >=1.030    Blood, UA neg    pH, UA 5.0    Protein, UA neg    Urobilinogen, UA 1.0    Nitrite, UA neg    Leukocytes, UA Negative        Assessment & Plan:  Type II or unspecified type diabetes mellitus without mention of complication, uncontrolled - Plan: POCT CBC, POCT glucose (manual entry), POCT urinalysis dipstick, Comprehensive metabolic panel  Coronary artery disease involving native coronary artery of native heart without angina pectoris   1. DMII: worsening/uncontrolled; continue Metformin at current dose; add Glucotrol XL 5mg  daily with meal.  Follow-up with PCP in upcoming month. 2.  CAD: new; s/p cardiology follow-up this morning; continues to have chest pain and DOE; recommend close follow-up with cardiology; to ED for severe pain.  Pt has discussed symptoms with cardiology.  Meds ordered this encounter  Medications  . DISCONTD: glipiZIDE (GLUCOTROL XL) 5 MG 24 hr tablet    Sig: Take 1 tablet (5 mg total) by mouth daily with breakfast.    Dispense:  30 tablet    Refill:  1  . DISCONTD: glipiZIDE (GLUCOTROL XL) 5 MG 24 hr tablet    Sig: Take 1 tablet (5 mg total) by mouth daily with breakfast.    Dispense:  30 tablet    Refill:  1    Return if symptoms worsen or fail to improve.  I personally performed the services described in this documentation, which was scribed in my presence.  The recorded information has been reviewed and is  accurate.  Nilda SimmerKristi Melodi Happel, M.D.  [facility]  [place] [address] [place], [place]  [address] [phone] phone [phone] fax

## 2014-02-08 NOTE — Telephone Encounter (Signed)
lmom  message on all phone numbers listed for pt. caaled with pt glucose lab. DM is out of control and he needs to go to PCP ASAP

## 2014-02-08 NOTE — Telephone Encounter (Signed)
Message copied by Jarvis NewcomerPARRIS-GODLEY, LISA S on Thu Feb 08, 2014 10:03 AM ------      Message from: Verdis PrimeSMITH, HENRY      Created: Thu Feb 08, 2014  9:28 AM       Tell him that DM is out of control and he needs to go to PCP ASAP ------

## 2014-02-09 LAB — COMPREHENSIVE METABOLIC PANEL
ALBUMIN: 4.3 g/dL (ref 3.5–5.2)
ALT: 18 U/L (ref 0–53)
AST: 13 U/L (ref 0–37)
Alkaline Phosphatase: 71 U/L (ref 39–117)
BUN: 19 mg/dL (ref 6–23)
CHLORIDE: 102 meq/L (ref 96–112)
CO2: 26 meq/L (ref 19–32)
Calcium: 9.4 mg/dL (ref 8.4–10.5)
Creat: 0.69 mg/dL (ref 0.50–1.35)
Glucose, Bld: 231 mg/dL — ABNORMAL HIGH (ref 70–99)
POTASSIUM: 4 meq/L (ref 3.5–5.3)
Sodium: 137 mEq/L (ref 135–145)
Total Bilirubin: 1 mg/dL (ref 0.2–1.2)
Total Protein: 7.1 g/dL (ref 6.0–8.3)

## 2014-02-12 ENCOUNTER — Encounter (HOSPITAL_COMMUNITY): Admission: RE | Admit: 2014-02-12 | Payer: BC Managed Care – PPO | Source: Ambulatory Visit

## 2014-02-14 ENCOUNTER — Telehealth (HOSPITAL_COMMUNITY): Payer: Self-pay | Admitting: General Practice

## 2014-02-14 ENCOUNTER — Encounter (HOSPITAL_COMMUNITY): Payer: BC Managed Care – PPO

## 2014-02-19 ENCOUNTER — Encounter (HOSPITAL_COMMUNITY): Payer: BC Managed Care – PPO

## 2014-02-21 ENCOUNTER — Encounter (HOSPITAL_COMMUNITY): Payer: BC Managed Care – PPO

## 2014-02-22 MED ORDER — ASPIRIN EC 81 MG PO TBEC: 81.0000 mg | DELAYED_RELEASE_TABLET | Freq: Every day | ORAL | Status: AC

## 2014-02-22 NOTE — Telephone Encounter (Signed)
pt wife given glucose lab results and Dr.Smith's instructiions.pt  DM is out of control and he needs to go to PCP ASAP.she sts that pt has an appt with pcp oon 02/28/14.pt wife sts that pt is going to continue brilinta and will not start plavix.adv her that pt should not take the aspirin 325mg  with brillinta only low dose 81mg .pt med list updated.pt wife verbalized understanding.

## 2014-02-22 NOTE — Telephone Encounter (Signed)
Message copied by Jarvis NewcomerPARRIS-GODLEY, LISA S on Thu Feb 22, 2014  4:00 PM ------      Message from: Verdis PrimeSMITH, HENRY      Created: Thu Feb 08, 2014  9:28 AM       Tell him that DM is out of control and he needs to go to PCP ASAP ------

## 2014-02-26 ENCOUNTER — Encounter (HOSPITAL_COMMUNITY): Payer: BC Managed Care – PPO

## 2014-02-28 ENCOUNTER — Encounter (HOSPITAL_COMMUNITY): Payer: BC Managed Care – PPO

## 2014-03-05 ENCOUNTER — Encounter (HOSPITAL_COMMUNITY): Payer: BC Managed Care – PPO

## 2014-03-07 ENCOUNTER — Encounter (HOSPITAL_COMMUNITY): Payer: BC Managed Care – PPO

## 2014-03-12 ENCOUNTER — Encounter (HOSPITAL_COMMUNITY): Payer: BC Managed Care – PPO

## 2014-03-14 ENCOUNTER — Encounter (HOSPITAL_COMMUNITY): Payer: BC Managed Care – PPO

## 2014-03-19 ENCOUNTER — Encounter (HOSPITAL_COMMUNITY): Payer: BC Managed Care – PPO

## 2014-03-19 ENCOUNTER — Ambulatory Visit: Payer: BC Managed Care – PPO | Admitting: Interventional Cardiology

## 2014-03-21 ENCOUNTER — Encounter (HOSPITAL_COMMUNITY): Payer: BC Managed Care – PPO

## 2014-03-23 ENCOUNTER — Encounter: Payer: Self-pay | Admitting: Interventional Cardiology

## 2014-03-26 ENCOUNTER — Ambulatory Visit: Payer: BC Managed Care – PPO | Admitting: Interventional Cardiology

## 2014-03-26 ENCOUNTER — Encounter (HOSPITAL_COMMUNITY): Payer: BC Managed Care – PPO

## 2014-03-28 ENCOUNTER — Encounter (HOSPITAL_COMMUNITY): Payer: BC Managed Care – PPO

## 2014-04-02 ENCOUNTER — Encounter (HOSPITAL_COMMUNITY): Payer: BC Managed Care – PPO

## 2014-04-04 ENCOUNTER — Encounter (HOSPITAL_COMMUNITY): Payer: BC Managed Care – PPO

## 2014-04-09 ENCOUNTER — Encounter (HOSPITAL_COMMUNITY): Payer: BC Managed Care – PPO

## 2014-04-11 ENCOUNTER — Encounter (HOSPITAL_COMMUNITY): Payer: BC Managed Care – PPO

## 2014-04-16 ENCOUNTER — Encounter (HOSPITAL_COMMUNITY): Payer: BC Managed Care – PPO

## 2014-04-18 ENCOUNTER — Encounter (HOSPITAL_COMMUNITY): Payer: BC Managed Care – PPO

## 2014-04-23 ENCOUNTER — Encounter (HOSPITAL_COMMUNITY): Payer: BC Managed Care – PPO

## 2014-04-25 ENCOUNTER — Encounter (HOSPITAL_COMMUNITY): Payer: BC Managed Care – PPO

## 2014-04-30 ENCOUNTER — Encounter (HOSPITAL_COMMUNITY): Payer: BC Managed Care – PPO

## 2014-05-02 ENCOUNTER — Encounter (HOSPITAL_COMMUNITY): Payer: BC Managed Care – PPO

## 2014-05-07 ENCOUNTER — Encounter (HOSPITAL_COMMUNITY): Payer: BC Managed Care – PPO

## 2014-05-09 ENCOUNTER — Encounter (HOSPITAL_COMMUNITY): Payer: BC Managed Care – PPO

## 2014-05-14 ENCOUNTER — Encounter (HOSPITAL_COMMUNITY): Payer: BC Managed Care – PPO

## 2014-05-16 ENCOUNTER — Encounter (HOSPITAL_COMMUNITY): Payer: BC Managed Care – PPO

## 2014-05-21 ENCOUNTER — Encounter (HOSPITAL_COMMUNITY): Payer: BC Managed Care – PPO

## 2014-05-23 ENCOUNTER — Encounter (HOSPITAL_COMMUNITY): Payer: BC Managed Care – PPO

## 2014-05-28 ENCOUNTER — Encounter (HOSPITAL_COMMUNITY): Payer: BC Managed Care – PPO

## 2014-05-30 ENCOUNTER — Ambulatory Visit (INDEPENDENT_AMBULATORY_CARE_PROVIDER_SITE_OTHER): Payer: BC Managed Care – PPO

## 2014-05-30 ENCOUNTER — Other Ambulatory Visit: Payer: Self-pay | Admitting: Family Medicine

## 2014-05-30 ENCOUNTER — Encounter (HOSPITAL_COMMUNITY): Payer: BC Managed Care – PPO

## 2014-05-30 ENCOUNTER — Ambulatory Visit (INDEPENDENT_AMBULATORY_CARE_PROVIDER_SITE_OTHER): Payer: BC Managed Care – PPO | Admitting: Family Medicine

## 2014-05-30 ENCOUNTER — Telehealth: Payer: Self-pay | Admitting: Interventional Cardiology

## 2014-05-30 ENCOUNTER — Telehealth: Payer: Self-pay | Admitting: *Deleted

## 2014-05-30 VITALS — BP 102/68 | HR 85 | Temp 98.1°F | Resp 14 | Ht 72.0 in | Wt 226.0 lb

## 2014-05-30 DIAGNOSIS — E1165 Type 2 diabetes mellitus with hyperglycemia: Secondary | ICD-10-CM | POA: Diagnosis not present

## 2014-05-30 DIAGNOSIS — R0602 Shortness of breath: Secondary | ICD-10-CM | POA: Diagnosis not present

## 2014-05-30 DIAGNOSIS — IMO0002 Reserved for concepts with insufficient information to code with codable children: Secondary | ICD-10-CM | POA: Diagnosis not present

## 2014-05-30 DIAGNOSIS — E118 Type 2 diabetes mellitus with unspecified complications: Secondary | ICD-10-CM

## 2014-05-30 DIAGNOSIS — I2510 Atherosclerotic heart disease of native coronary artery without angina pectoris: Secondary | ICD-10-CM

## 2014-05-30 DIAGNOSIS — I2583 Coronary atherosclerosis due to lipid rich plaque: Secondary | ICD-10-CM | POA: Diagnosis not present

## 2014-05-30 DIAGNOSIS — R42 Dizziness and giddiness: Secondary | ICD-10-CM

## 2014-05-30 LAB — TROPONIN I: Troponin I: 0.01 ng/mL (ref <0.06)

## 2014-05-30 LAB — POCT CBC
Granulocyte percent: 76.2 % (ref 37–80)
HCT, POC: 52.9 % (ref 43.5–53.7)
Hemoglobin: 17 g/dL (ref 14.1–18.1)
Lymph, poc: 1.4 (ref 0.6–3.4)
MCH, POC: 29.3 pg (ref 27–31.2)
MCHC: 32.2 g/dL (ref 31.8–35.4)
MCV: 91 fL (ref 80–97)
MID (cbc): 0.2 (ref 0–0.9)
MPV: 7.2 fL (ref 0–99.8)
POC Granulocyte: 5.1 (ref 2–6.9)
POC LYMPH PERCENT: 21 %L (ref 10–50)
POC MID %: 2.8 %M (ref 0–12)
Platelet Count, POC: 244 10*3/uL (ref 142–424)
RBC: 5.81 M/uL (ref 4.69–6.13)
RDW, POC: 1.7 %
WBC: 6.7 10*3/uL (ref 4.6–10.2)

## 2014-05-30 LAB — POCT URINALYSIS DIPSTICK
Bilirubin, UA: NEGATIVE
Blood, UA: NEGATIVE
Glucose, UA: 500
Ketones, UA: NEGATIVE
Leukocytes, UA: NEGATIVE
Nitrite, UA: NEGATIVE
Protein, UA: NEGATIVE
Spec Grav, UA: <=1.005
Urobilinogen, UA: 0.2
pH, UA: 5

## 2014-05-30 LAB — GLUCOSE, POCT (MANUAL RESULT ENTRY)

## 2014-05-30 LAB — POCT GLYCOSYLATED HEMOGLOBIN (HGB A1C): Hemoglobin A1C: 7.9

## 2014-05-30 NOTE — Telephone Encounter (Signed)
SPOKE WITH  PT 'S  WIFE    FOR  SOME TIME   WAS  HARD  TO  UNDERSTAND  BAD  PHONE  CONNECTION PT  LYING  DOWN AT  THIS TIME  AFTER  HAVING TAKEN  1  NTG  FOR  SOB  WITH NO RELIEF  HAD  CHEST PAIN   PRIOR  TO CATH  NOT  THE  SAME  SYMPTOMS  PER  PT NO WEIGHT  GAIN    WILL CONT  TO MONITOR MAY  SEEK  CARE  AT URGENT  CARE  OR   GO TO ER  FOR  EVAL  AND  TX  IF NO  IMPROVEMENT  IN SYMPTOMS  WANTS APPT  WITH  DR Katrinka BlazingSMITH   NOTHING  AVAILABLE UNTIL  SEPT  WILL FORWARD   [facility]  [facility]  [facility] AND   LISA FOR  REVIEW .Zack Seal/CY

## 2014-05-30 NOTE — Telephone Encounter (Signed)
Please re-contact the patient and let me know how he is doing.

## 2014-05-30 NOTE — Telephone Encounter (Signed)
New problem:     Pt's wife called to report pt has been having SOB and dizzy for the pass 30: 40 min.    And will not go to the ER said to call this office.

## 2014-05-30 NOTE — Telephone Encounter (Signed)
Called Mr. Schmuck at 7:58 pm on 05/30/2014 to inform him that his Troponin I lab work came back normal. [name] understood and was told to go to the ER if his symptoms worsen.

## 2014-05-30 NOTE — Progress Notes (Signed)
Chief Complaint:  Chief Complaint  Patient presents with  . Shortness of Breath    started today   . Dizziness    HPI: Tristan Camacho is a 48 y.o. 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 He panicked and took a nitroglycerin and became dizzy. PRior to coming in at 3 pm he had 2 bowels of rice. HE has been taking all of his medications including his HTN Meds,  blood thiner, his invokana, his metformin and 10 units of lantus at night. He states his sugars have been under control. Fasting blood glucose ahs been 95-150s in the morning. He was walking , He denies CP, palpitations, n/v/w tingling, abd pain, jaw pain, increase weight gain, cough, URI sxs, asthma, pedal edema, increase orthopnea, PND, uses only 1 pillow at night.  No prior symptoms like this before. He  has had cardiac cath with sent  Of LAD in February with Dr Katrinka BlazingSmith  No new meds in the last 1 month , he took his nitriglycerin today because he was told he should take it when he felt anything in his chest, again he denies CP. Does not feel like what he did when he came in for CP in Feb 2015.  He feels better when he is utside rather than inside.  He did not take BP or glucose when he felt SOB. Wife has pulse ox at home but did not take his pulse ox.   Denies any risk factors for DVT/PE: no recent travel surgeries, malignancies, injuries, prior DVT/PE, leg pain  Wt Readings from Last 3 Encounters:  05/30/14 226 lb (102.513 kg)  02/08/14 226 lb 12.8 oz (102.876 kg)  01/25/14 228 lb 1.9 oz (103.475 kg)    BP Readings from Last 3 Encounters:  05/30/14 102/68  02/08/14 108/68  01/25/14 116/77   SpO2 Readings from Last 3 Encounters:  05/30/14 97%  02/08/14 97%  01/25/14 99%     He has a cardiologist. Dr Verdis PrimeHenry  Smith on  01/2014 ASSESSMENT:  1. Coronary atherosclerotic heart disease with chronically occluded right coronary and recently stented LAD  2. Exertional dyspnea, likely related to diastolic heart failure  3. Hypertension  4. Hyperlipidemia  5. Diabetes  PLAN:  1. Change lisinopril 40 mg per day to lisinopril HCTZ 20/12.5 mg per day  2. Clinical followup in 6-8 weeks  3. Change Brilinta to clopidogrel 75 mg per day along with aspirin 325 mg per day  4. Dermatologist for facial rash  5. Call if other clinical problems prior to the return appointment  Cardiac Cather Report 12/2013:  IMPRESSIONS: 1. Unstable angina felt secondary to high-grade stenosis in the proximal LAD.  2. Successful stenting of the proximal LAD from 90% to 0% with TIMI grade 3 flow  3. Chronic total occlusion of the distal right coronary, collateralized by right to right and left (left atrial recurrent) to right collaterals  4. Overall normal left ventricular function    Past Medical History  Diagnosis Date  . Diabetes mellitus without complication   . GERD (gastroesophageal reflux disease)   . Hypertension   . CAD (coronary artery disease)     a. 12/2013 Cath/PCI: LM nl, LAD 90p (3.0x16 Promus Premier DES), LCX nl, RCA 100, r->r,  l->r collats, EF 60% w/o rwma.  . Hyperlipidemia   . Tobacco abuse    Past Surgical History  Procedure Laterality Date  . Shoulder surgery Right   . Coronary stent placement     History   Social History  . Marital Status: Married    Spouse Name: N/A    Number of Children: N/A  . Years of Education: N/A   Social History Main Topics  . Smoking status: Former Games developer  . Smokeless tobacco: None  . Alcohol Use: None  . Drug Use: None  . Sexual Activity: None   Other Topics Concern  . None   Social History Narrative  . None   History reviewed. No pertinent family history. No Known Allergies Prior to Admission medications   Medication Sig Start Date End Date Taking? Authorizing  Provider  aspirin EC 81 MG tablet Take 1 tablet (81 mg total) by mouth daily. 02/22/14  Yes Lyn Records III, MD  atorvastatin (LIPITOR) 40 MG tablet Take 1 tablet (40 mg total) by mouth daily at 6 PM. 12/21/13  Yes Ok Anis, NP  glipiZIDE (GLUCOTROL XL) 5 MG 24 hr tablet Take 1 tablet (5 mg total) by mouth daily with breakfast. 02/08/14  Yes [name], MD  lisinopril-hydrochlorothiazide (PRINZIDE) 20-12.5 MG per tablet Take 1 tablet by mouth daily. 01/25/14  Yes Lyn Records III, MD  metFORMIN (GLUCOPHAGE) 1000 MG tablet Take 1 tablet (1,000 mg total) by mouth 2 (two) times daily with a meal. 12/21/13  Yes Ok Anis, NP  metoprolol tartrate (LOPRESSOR) 25 MG tablet Take 0.5 tablets (12.5 mg total) by mouth 2 (two) times daily. 12/21/13  Yes Ok Anis, NP  nitroGLYCERIN (NITROSTAT) 0.4 MG SL tablet Place 1 tablet (0.4 mg total) under the tongue every 5 (five) minutes x 3 doses as needed for chest pain. 12/21/13  Yes Ok Anis, NP  Ticagrelor (BRILINTA) 90 MG TABS tablet Take 90 mg by mouth 2 (two) times daily. Taking for the next 10 days has sample from Dr [facility] office then switching to clopidogrel once a day   Yes Historical Provider, MD     ROS: The patient denies fevers, chills, night sweats, unintentional weight loss, chest pain, palpitations, wheezing, dyspnea on exertion, nausea, vomiting, abdominal pain, dysuria, hematuria, melena, numbness, weakness, or tingling.   All other systems have been reviewed and were otherwise negative with the exception of those mentioned in the HPI and as above.    PHYSICAL EXAM: Filed Vitals:   05/30/14 1712  BP: 102/68  Pulse: 85  Temp: 98.1 F (36.7 C)  Resp: 14  SPo2 97% Filed Vitals:   05/30/14 1712  Height: 6' (1.829 m)  Weight: 226 lb (102.513 kg)   Body mass index is 30.64 kg/(m^2).  General: Alert, no acute distress, he is playing with his cell phone HEENT:  Normocephalic, atraumatic, oropharynx  patent. EOMI, PERRLA, tm nl, no exudates Cardiovascular:  Regular rate and rhythm, no rubs murmurs or gallops.  No Carotid bruits, radial pulse intact. No pedal edema.  Respiratory: Clear to auscultation bilaterally.  No wheezes, rales, or rhonchi.  No cyanosis, no use of accessory musculature GI: No organomegaly, abdomen is soft and non-tender, positive bowel sounds.  No masses. Skin: No rashes. Neurologic: Facial musculature symmetric. Psychiatric: Patient is appropriate throughout our interaction. Lymphatic: No cervical lymphadenopathy Musculoskeletal: Gait intact.   LABS: Results for orders placed in visit on 05/30/14  POCT CBC  Result Value Ref Range   WBC 6.7  4.6 - 10.2 K/uL   Lymph, poc 1.4  0.6 - 3.4   POC LYMPH PERCENT 21.0  10 - 50 %L   MID (cbc) 0.2  0 - 0.9   POC MID % 2.8  0 - 12 %M   POC Granulocyte 5.1  2 - 6.9   Granulocyte percent 76.2  37 - 80 %G   RBC 5.81  4.69 - 6.13 M/uL   Hemoglobin 17.0  14.1 - 18.1 g/dL   HCT, POC 96.0  45.4 - 53.7 %   MCV 91.0  80 - 97 fL   MCH, POC 29.3  27 - 31.2 pg   MCHC 32.2  31.8 - 35.4 g/dL   RDW, POC 1.7     Platelet Count, POC 244  142 - 424 K/uL   MPV 7.2  0 - 99.8 fL  GLUCOSE, POCT (MANUAL RESULT ENTRY)      Result Value Ref Range   POC Glucose over 444  70 - 99 mg/dl  POCT URINALYSIS DIPSTICK      Result Value Ref Range   Color, UA yellow     Clarity, UA clear     Glucose, UA 500     Bilirubin, UA neg     Ketones, UA neg     Spec Grav, UA <=1.005     Blood, UA neg     pH, UA 5.0     Protein, UA neg     Urobilinogen, UA 0.2     Nitrite, UA neg     Leukocytes, UA Negative    POCT GLYCOSYLATED HEMOGLOBIN (HGB A1C)      Result Value Ref Range   Hemoglobin A1C 7.9       EKG/XRAY:   Primary read interpreted by Dr. Conley Rolls at [facility]. No acute cardiopulmonary process EKG shows no acute ST elevation/Depression  ASSESSMENT/PLAN: Encounter Diagnoses  Name Primary?  . Dizziness and giddiness Yes  . SOB (shortness  of breath)   . 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 He took a nitroglycerin and did not feel better, he felt dizzy  So came in to be evaluated. There is somewhat of a language barrier, his wife is translating for him CXR and EKG looks stable, orthostatics normal, VSS, low to no risk for DVT/PE, inouse CBC and UA were engtive for anything acute, glucosuria expected due to being on invokana. ? Etiology of sxs: CCardiac vs PUlm vs DM --currently feeling much better Blood glucose was higher than normal for someone who ate 4 hours prior, so will ask him to monitor. He feels better after getting 1 L of IVF after CXR was done and did not show that he is in acute CHF. He has no CP, due to h/o CAD with stent will get stat troponins, if elevated then patient will be advised to go to ER.  Diabetes ? Etiology of sxs , will bring glucose monitor in the AM, there is some inconsistencies in what they are telling me with his meds F/u tomorrow with glucose monitor and all meds. BAsed on Dr Michaelle Copas note from 01/2014 he is supposed to be on Plavix and also ASA but he is not , is still taking Brilinta.  I will call in the AM Dr [facility] office for clarification Will await stat labs Declined go to ER for further workup, advise to monitor for worsening sxs, he states he is better, if needed will go to ER.    Gross sideeffects, risk and benefits, and alternatives of medications d/w patient. Patient is aware that all medications have potential sideeffects and we are unable to predict every sideeffect or drug-drug interaction that may occur.  Hamilton Capri PHUONG, DO 05/30/2014 7:18 PM

## 2014-05-31 ENCOUNTER — Ambulatory Visit (INDEPENDENT_AMBULATORY_CARE_PROVIDER_SITE_OTHER): Payer: BC Managed Care – PPO | Admitting: Family Medicine

## 2014-05-31 VITALS — BP 126/80 | HR 88 | Temp 97.6°F | Resp 16 | Ht 71.25 in | Wt 223.0 lb

## 2014-05-31 DIAGNOSIS — I2510 Atherosclerotic heart disease of native coronary artery without angina pectoris: Secondary | ICD-10-CM

## 2014-05-31 DIAGNOSIS — R0602 Shortness of breath: Secondary | ICD-10-CM

## 2014-05-31 DIAGNOSIS — I10 Essential (primary) hypertension: Secondary | ICD-10-CM

## 2014-05-31 LAB — BASIC METABOLIC PANEL WITH GFR
BUN: 22 mg/dL (ref 6–23)
Calcium: 9.7 mg/dL (ref 8.4–10.5)
Creat: 0.96 mg/dL (ref 0.50–1.35)
GFR, Est African American: >89 mL/min
GFR, Est Non African American: >89 mL/min
Glucose, Bld: 378 mg/dL — ABNORMAL HIGH (ref 70–99)
Potassium: 4.1 mEq/L (ref 3.5–5.3)

## 2014-05-31 LAB — BASIC METABOLIC PANEL WITHOUT GFR
CO2: 24 meq/L (ref 19–32)
Chloride: 99 meq/L (ref 96–112)
Sodium: 135 meq/L (ref 135–145)

## 2014-05-31 MED ORDER — CLOPIDOGREL BISULFATE 75 MG PO TABS: 75.0000 mg | ORAL_TABLET | Freq: Every day | ORAL | Status: DC

## 2014-05-31 NOTE — Telephone Encounter (Signed)
Spoke with patient he is doing better,no SOB or CP overnight, feeling better today, his FBG was 118. Spoke with Dr Katrinka BlazingSmith, will dc patient of brilinta and continue with ASA 81 mg and also PLavix 75 mg daily.  Interestingly he told me that brilinta can cause episodic dyspnea. Currently patient is asymptomatic. Patient advise to return to office for review of meds today.

## 2014-05-31 NOTE — Progress Notes (Signed)
Chief Complaint:  Chief Complaint  Patient presents with  . Follow-up    HPI: Tristan Camacho is a 48 y.o. male who is here for recheck of his SOB , similar , no worsening sxs.  Again he denies any SOB and CP together, he denies asthma sxs, he denies allergies, he denies weight gain, worsening CHF sxs We did labs yesterday and they were relatively unremarkable. His torpnin x 1 was negative, CXR was negative, EKG  This morning blood glucose was 118.  He is taking his meds daily, he Is on Brilinta and per cardiology can cause dyspnea and it could be acute onset.    Wt Readings from Last 3 Encounters:  05/31/14 223 lb (101.152 kg)  05/30/14 226 lb (102.513 kg)  02/08/14 226 lb 12.8 oz (102.876 kg)     Past Medical History  Diagnosis Date  . Diabetes mellitus without complication   . GERD (gastroesophageal reflux disease)   . Hypertension   . CAD (coronary artery disease)     a. 12/2013 Cath/PCI: LM nl, LAD 90p (3.0x16 Promus Premier DES), LCX nl, RCA 100, r->r, l->r collats, EF 60% w/o rwma.  . Hyperlipidemia   . Tobacco abuse    Past Surgical History  Procedure Laterality Date  . Shoulder surgery Right   . Coronary stent placement     History   Social History  . Marital Status: Married    Spouse Name: N/A    Number of Children: N/A  . Years of Education: N/A   Social History Main Topics  . Smoking status: Former Games developer  . Smokeless tobacco: None  . Alcohol Use: None  . Drug Use: None  . Sexual Activity: None   Other Topics Concern  . None   Social History Narrative  . None   History reviewed. No pertinent family history. No Known Allergies Prior to Admission medications   Medication Sig Start Date End Date Taking? Authorizing Provider  aspirin EC 81 MG tablet Take 1 tablet (81 mg total) by mouth daily. 02/22/14  Yes Lyn Records III, MD  atorvastatin (LIPITOR) 40 MG tablet Take 1 tablet (40 mg total) by mouth daily at 6 PM. 12/21/13  Yes Ok Anis, NP  glipiZIDE (GLUCOTROL XL) 5 MG 24 hr tablet Take 1 tablet (5 mg total) by mouth daily with breakfast. 02/08/14  Yes [name], MD  lisinopril-hydrochlorothiazide (PRINZIDE) 20-12.5 MG per tablet Take 1 tablet by mouth daily. 01/25/14  Yes Lyn Records III, MD  metFORMIN (GLUCOPHAGE) 1000 MG tablet Take 1 tablet (1,000 mg total) by mouth 2 (two) times daily with a meal. 12/21/13  Yes Ok Anis, NP  metoprolol tartrate (LOPRESSOR) 25 MG tablet Take 0.5 tablets (12.5 mg total) by mouth 2 (two) times daily. 12/21/13  Yes Ok Anis, NP  nitroGLYCERIN (NITROSTAT) 0.4 MG SL tablet Place 1 tablet (0.4 mg total) under the tongue every 5 (five) minutes x 3 doses as needed for chest pain. 12/21/13  Yes Ok Anis, NP  Ticagrelor (BRILINTA) 90 MG TABS tablet Take 90 mg by mouth 2 (two) times daily. Taking for the next 10 days has sample from Dr [facility] office then switching to clopidogrel once a day   Yes Historical Provider, MD     ROS: The patient denies fevers, chills, night sweats, unintentional weight loss, chest pain, palpitations, wheezing, dyspnea on exertion, nausea, vomiting, abdominal pain, dysuria, hematuria, melena, numbness, weakness, or tingling.  All other systems have been reviewed and were otherwise negative with the exception of those mentioned in the HPI and as above.    PHYSICAL EXAM: Filed Vitals:   05/31/14 1629  BP: 126/80  Pulse: 88  Temp: 97.6 F (36.4 C)  Resp: 16   Filed Vitals:   05/31/14 1629  Height: 5' 11.25" (1.81 m)  Weight: 223 lb (101.152 kg)   Body mass index is 30.88 kg/(m^2).  General: Alert, no acute distress HEENT:  Normocephalic, atraumatic, oropharynx patent. EOMI, PERRLA Cardiovascular:  Regular rate and rhythm, no rubs murmurs or gallops.  No Carotid bruits, radial pulse intact. No pedal edema.  Respiratory: Clear to auscultation bilaterally.  No wheezes, rales, or rhonchi.  No cyanosis, no use of accessory  musculature GI: No organomegaly, abdomen is soft and non-tender, positive bowel sounds.  No masses. Skin: No rashes. Neurologic: Facial musculature symmetric. Psychiatric: Patient is appropriate throughout our interaction. Lymphatic: No cervical lymphadenopathy Musculoskeletal: Gait intact.   LABS: Results for orders placed in visit on 05/30/14  BASIC METABOLIC PANEL WITH GFR      Result Value Ref Range   Sodium 135  135 - 145 mEq/L   Potassium 4.1  3.5 - 5.3 mEq/L   Chloride 99  96 - 112 mEq/L   CO2 24  19 - 32 mEq/L   Glucose, Bld 378 (*) 70 - 99 mg/dL   BUN 22  6 - 23 mg/dL   Creat 1.61  0.96 - 0.45 mg/dL   Calcium 9.7  8.4 - 40.9 mg/dL   GFR, Est African American >89     GFR, Est Non African American >89    TROPONIN I      Result Value Ref Range   Troponin I 0.01  <0.06 ng/mL  POCT CBC      Result Value Ref Range   WBC 6.7  4.6 - 10.2 K/uL   Lymph, poc 1.4  0.6 - 3.4   POC LYMPH PERCENT 21.0  10 - 50 %L   MID (cbc) 0.2  0 - 0.9   POC MID % 2.8  0 - 12 %M   POC Granulocyte 5.1  2 - 6.9   Granulocyte percent 76.2  37 - 80 %G   RBC 5.81  4.69 - 6.13 M/uL   Hemoglobin 17.0  14.1 - 18.1 g/dL   HCT, POC 81.1  91.4 - 53.7 %   MCV 91.0  80 - 97 fL   MCH, POC 29.3  27 - 31.2 pg   MCHC 32.2  31.8 - 35.4 g/dL   RDW, POC 1.7     Platelet Count, POC 244  142 - 424 K/uL   MPV 7.2  0 - 99.8 fL  GLUCOSE, POCT (MANUAL RESULT ENTRY)      Result Value Ref Range   POC Glucose over 444  70 - 99 mg/dl  POCT URINALYSIS DIPSTICK      Result Value Ref Range   Color, UA yellow     Clarity, UA clear     Glucose, UA 500     Bilirubin, UA neg     Ketones, UA neg     Spec Grav, UA <=1.005     Blood, UA neg     pH, UA 5.0     Protein, UA neg     Urobilinogen, UA 0.2     Nitrite, UA neg     Leukocytes, UA Negative    POCT GLYCOSYLATED HEMOGLOBIN (HGB A1C)  Result Value Ref Range   Hemoglobin A1C 7.9       EKG/XRAY:   Primary [facility]   ASSESSMENT/PLAN: Encounter Diagnoses  Name Primary?  . CAD, multiple vessel   . Essential hypertension   . SOB (shortness of breath) Yes   Stop Brilinta, hopefully by stopping Brilinta he was no longer have dyspnea sxs.  Start plavix 75 mg daily, cont with ASA 81 mg  Medication cahnges have been d/w his cardiologist Dr Katrinka BlazingSmith.  Fu if worsening sxs otherwise in 3 months for DM recheck   Gross sideeffects, risk and benefits, and alternatives of medications d/w patient. Patient is aware that all medications have potential sideeffects and we are unable to predict every sideeffect or drug-drug interaction that may occur.  Ermon Sagan PHUONG, DO 05/31/2014 5:20 PM

## 2014-05-31 NOTE — Telephone Encounter (Signed)
pt wife called back and sts that pt was seen yesterday by pcp Dr.Le to establish care. pt is doing better today. pt is past due for a f/ u with Dr.Smith appt sch for 7/30@4pm  with Dr.Smith. pt wife aware of appt.

## 2014-05-31 NOTE — Telephone Encounter (Signed)
called to ck on pt lmtcb. 

## 2014-06-01 ENCOUNTER — Telehealth: Payer: Self-pay

## 2014-06-01 LAB — BILIRUBIN, TOTAL: Total Bilirubin: <0.1 mg/dL — ABNORMAL LOW (ref 0.2–1.2)

## 2014-06-01 LAB — ALT: ALT: 25 U/L (ref 0–53)

## 2014-06-01 LAB — PHOSPHORUS: Phosphorus: 5.5 mg/dL — ABNORMAL HIGH (ref 2.3–4.6)

## 2014-06-01 LAB — BILIRUBIN,DIRECT & INDIRECT (FRACTIONATED): Bilirubin, Direct: 0.1 mg/dL (ref 0.0–0.3)

## 2014-06-01 LAB — AST: AST: 16 U/L (ref 0–37)

## 2014-06-01 NOTE — Telephone Encounter (Signed)
Message copied by Johnnette LitterARDWELL, Sharice Harriss M on Fri Jun 01, 2014 12:13 PM ------      Message from: Hamilton CapriLE, THAO P      Created: Thu May 31, 2014 11:21 AM       [facility] and ask them to change the BMP to a CMP with GFR. IT was ordered wrong the first time.             Thanks            Tle ------

## 2014-06-01 NOTE — Telephone Encounter (Signed)
Spoke with Hurshel Keys at [place]. Unable to change from BMP to CMP, but was able to add ALT, AST, Alk Phos, Albumin and Tot Protein

## 2014-06-04 ENCOUNTER — Encounter (HOSPITAL_COMMUNITY): Payer: BC Managed Care – PPO

## 2014-06-06 ENCOUNTER — Encounter (HOSPITAL_COMMUNITY): Payer: BC Managed Care – PPO

## 2014-06-07 ENCOUNTER — Ambulatory Visit: Payer: BC Managed Care – PPO | Admitting: Interventional Cardiology

## 2014-06-11 ENCOUNTER — Encounter (HOSPITAL_COMMUNITY): Payer: BC Managed Care – PPO

## 2014-06-13 ENCOUNTER — Encounter (HOSPITAL_COMMUNITY): Payer: BC Managed Care – PPO

## 2014-06-18 ENCOUNTER — Encounter (HOSPITAL_COMMUNITY): Payer: BC Managed Care – PPO

## 2014-06-20 ENCOUNTER — Encounter (HOSPITAL_COMMUNITY): Payer: BC Managed Care – PPO

## 2014-07-19 ENCOUNTER — Ambulatory Visit: Payer: BC Managed Care – PPO | Admitting: Interventional Cardiology

## 2014-07-31 ENCOUNTER — Ambulatory Visit (INDEPENDENT_AMBULATORY_CARE_PROVIDER_SITE_OTHER): Payer: BC Managed Care – PPO | Admitting: Family Medicine

## 2014-07-31 VITALS — BP 100/60 | HR 94 | Temp 98.3°F | Resp 18 | Ht 71.5 in | Wt 220.0 lb

## 2014-07-31 DIAGNOSIS — R0602 Shortness of breath: Secondary | ICD-10-CM

## 2014-07-31 DIAGNOSIS — I2510 Atherosclerotic heart disease of native coronary artery without angina pectoris: Secondary | ICD-10-CM

## 2014-07-31 DIAGNOSIS — E1169 Type 2 diabetes mellitus with other specified complication: Secondary | ICD-10-CM

## 2014-07-31 DIAGNOSIS — Z23 Encounter for immunization: Secondary | ICD-10-CM

## 2014-07-31 DIAGNOSIS — E785 Hyperlipidemia, unspecified: Secondary | ICD-10-CM

## 2014-07-31 DIAGNOSIS — I10 Essential (primary) hypertension: Secondary | ICD-10-CM

## 2014-07-31 LAB — POCT URINALYSIS DIPSTICK
Bilirubin, UA: NEGATIVE
Blood, UA: NEGATIVE
Glucose, UA: 500
Ketones, UA: NEGATIVE
Leukocytes, UA: NEGATIVE
Nitrite, UA: NEGATIVE
Protein, UA: NEGATIVE
Spec Grav, UA: 1.01
Urobilinogen, UA: 0.2
pH, UA: 5

## 2014-07-31 LAB — COMPREHENSIVE METABOLIC PANEL
ALT: 18 U/L (ref 0–53)
AST: 15 U/L (ref 0–37)
Albumin: 4.3 g/dL (ref 3.5–5.2)
Alkaline Phosphatase: 56 U/L (ref 39–117)
BUN: 18 mg/dL (ref 6–23)
CO2: 27 mEq/L (ref 19–32)
Calcium: 9.1 mg/dL (ref 8.4–10.5)
Chloride: 102 mEq/L (ref 96–112)
Creat: 0.75 mg/dL (ref 0.50–1.35)
Glucose, Bld: 280 mg/dL — ABNORMAL HIGH (ref 70–99)
Potassium: 4 mEq/L (ref 3.5–5.3)
Sodium: 136 mEq/L (ref 135–145)
Total Bilirubin: 0.9 mg/dL (ref 0.2–1.2)
Total Protein: 7.4 g/dL (ref 6.0–8.3)

## 2014-07-31 LAB — LIPID PANEL
Cholesterol: 178 mg/dL (ref 0–200)
HDL: 34 mg/dL — ABNORMAL LOW (ref >39)
LDL Cholesterol: 88 mg/dL (ref 0–99)
Total CHOL/HDL Ratio: 5.2 Ratio
Triglycerides: 281 mg/dL — ABNORMAL HIGH (ref <150)
VLDL: 56 mg/dL — ABNORMAL HIGH (ref 0–40)

## 2014-07-31 LAB — GLUCOSE, POCT (MANUAL RESULT ENTRY): POC Glucose: 294 mg/dl — AB (ref 70–99)

## 2014-07-31 LAB — POCT GLYCOSYLATED HEMOGLOBIN (HGB A1C): Hemoglobin A1C: 7.5

## 2014-07-31 LAB — MICROALBUMIN, URINE: Microalb, Ur: 0.2 mg/dL (ref <2.0)

## 2014-07-31 MED ORDER — GLIPIZIDE ER 5 MG PO TB24: 5.0000 mg | ORAL_TABLET | Freq: Every day | ORAL | Status: DC

## 2014-07-31 MED ORDER — CANAGLIFLOZIN 300 MG PO TABS: 300.0000 mg | ORAL_TABLET | Freq: Every day | ORAL | Status: DC

## 2014-07-31 MED ORDER — ATORVASTATIN CALCIUM 40 MG PO TABS: 40.0000 mg | ORAL_TABLET | Freq: Every day | ORAL | Status: DC

## 2014-07-31 MED ORDER — METOPROLOL TARTRATE 25 MG PO TABS: 12.5000 mg | ORAL_TABLET | Freq: Two times a day (BID) | ORAL | Status: DC

## 2014-07-31 MED ORDER — METFORMIN HCL 1000 MG PO TABS: 1000.0000 mg | ORAL_TABLET | Freq: Two times a day (BID) | ORAL | Status: DC

## 2014-07-31 MED ORDER — LISINOPRIL-HYDROCHLOROTHIAZIDE 20-12.5 MG PO TABS: 1.0000 | ORAL_TABLET | Freq: Every day | ORAL | Status: DC

## 2014-07-31 MED ORDER — CLOPIDOGREL BISULFATE 75 MG PO TABS: 75.0000 mg | ORAL_TABLET | Freq: Every day | ORAL | Status: DC

## 2014-07-31 NOTE — Progress Notes (Signed)
Patient ID: Tristan Camacho MRN: [id number], DOB: Jan 28, 1966, 48 y.o. Date of Encounter: 07/31/2014, 10:08 AM  Primary Physician: No PCP Per Patient  Chief Complaint: Diabetes follow up  HPI: 48 y.o. year old male with history below presents for follow up of diabetes mellitus. Doing well. No issues or complaints. Taking medications daily without adverse effects. Positive polydipsia, polyphagia, polyuria, or nocturia.  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  He works at [employer] here.  He saw foot doctor for Morton's neuroma at beginning of year.  Feet now more comfortable He had stent placed in February.  No smoking since stent placed.  He did try one cigarette in [place] but it made him short of breath  He is having problem with night time driving Blood sugars at home: 150-200+ Diet consists of: nothing special No regular exercising even though he had a gym membership Last A1C:  Lab Results  Component Value Date   HGBA1C 7.9 05/30/2014    Eye MD:  never DDS: no Influenza vaccine:  Last year Pneumococcal vaccine:  never   Past Medical History  Diagnosis Date  . Diabetes mellitus without complication   . GERD (gastroesophageal reflux disease)   . Hypertension   . CAD (coronary artery disease)     a. 12/2013 Cath/PCI: LM nl, LAD 90p (3.0x16 Promus Premier DES), LCX nl, RCA 100, r->r, l->r collats, EF 60% w/o rwma.  . Hyperlipidemia   . Tobacco abuse      Home Meds: Prior to Admission medications   Medication Sig Start Date End Date Taking? Authorizing Provider  aspirin EC 81 MG tablet Take 1 tablet (81 mg total) by mouth daily. 02/22/14  Yes Lyn Records III, MD  atorvastatin (LIPITOR) 40 MG tablet Take 1 tablet (40 mg total) by mouth daily at 6 PM. 12/21/13  Yes Ok Anis, NP  Canagliflozin (INVOKANA) 300 MG TABS Take 300 mg by mouth daily.   Yes Historical Provider, MD  clopidogrel (PLAVIX) 75 MG  tablet Take 1 tablet (75 mg total) by mouth daily. 05/31/14  Yes Thao P Le, DO  lisinopril-hydrochlorothiazide (PRINZIDE) 20-12.5 MG per tablet Take 1 tablet by mouth daily. 01/25/14  Yes Lyn Records III, MD  metFORMIN (GLUCOPHAGE) 1000 MG tablet Take 1 tablet (1,000 mg total) by mouth 2 (two) times daily with a meal. 12/21/13  Yes Ok Anis, NP  metoprolol tartrate (LOPRESSOR) 25 MG tablet Take 0.5 tablets (12.5 mg total) by mouth 2 (two) times daily. 12/21/13  Yes Ok Anis, NP  nitroGLYCERIN (NITROSTAT) 0.4 MG SL tablet Place 1 tablet (0.4 mg total) under the tongue every 5 (five) minutes x 3 doses as needed for chest pain. 12/21/13  Yes Ok Anis, NP  glipiZIDE (GLUCOTROL XL) 5 MG 24 hr tablet Take 1 tablet (5 mg total) by mouth daily with breakfast. 02/08/14   [name], MD    Allergies: No Known Allergies  History   Social History  . Marital Status: Married    Spouse Name: N/A    Number of Children: N/A  . Years of Education: N/A   Occupational History  . Not on file.   Social History Main Topics  . Smoking status: Former Games developer  . Smokeless tobacco: Not on file  . Alcohol Use: Not on file  . Drug Use: Not on file  . Sexual Activity: Not on file   Other Topics Concern  .  Not on file   Social History Narrative  . No narrative on file     Lab Results  Component Value Date   HGBA1C 7.9 05/30/2014    Review of Systems: Constitutional: negative for chills, fever, night sweats, weight changes, or fatigue  HEENT: negative for vision changes, hearing loss, congestion, rhinorrhea, or epistaxis Cardiovascular: negative for chest pain, palpitations, diaphoresis, DOE, orthopnea, or edema Respiratory: negative for hemoptysis, wheezing, shortness of breath, dyspnea, or cough Abdominal: negative for abdominal pain, nausea, vomiting, diarrhea, or constipation Dermatological: negative for rash, erythema, or wounds Neurologic: negative for headache,  dizziness, or syncope Renal:  Negative for polyuria, polydipsia, or dysuria All other systems reviewed and are otherwise negative with the exception to those above and in the HPI.   Physical Exam: Blood pressure 100/60, pulse 94, temperature 98.3 F (36.8 C), temperature source Oral, resp. rate 18, height 5' 11.5" (1.816 m), weight 220 lb (99.791 kg), SpO2 98.00%., Body mass index is 30.26 kg/(m^2). Wt Readings from Last 3 Encounters:  07/31/14 220 lb (99.791 kg)  05/31/14 223 lb (101.152 kg)  05/30/14 226 lb (102.513 kg)   BP Readings from Last 3 Encounters:  07/31/14 100/60  05/31/14 126/80  05/30/14 102/68   General: Well developed, well nourished, in no acute distress. Head: Normocephalic, atraumatic, eyes without discharge, sclera non-icteric, nares are without discharge. Bilateral auditory canals clear, TM's are without perforation, pearly grey and translucent with reflective cone of light bilaterally. Oral cavity moist, posterior pharynx without exudate, erythema, peritonsillar abscess, or post nasal drip.  Neck: Supple. No thyromegaly. Full ROM. No lymphadenopathy. Lungs: Clear bilaterally to auscultation without wheezes, rales, or rhonchi. Breathing is unlabored. Heart: RRR with S1 S2. No murmurs, rubs, or gallops appreciated. Abdomen: Soft, non-tender, non-distended with normoactive bowel sounds. No hepatosplenomegaly. No rebound/guarding. No obvious abdominal masses. Msk:  Strength and tone normal for age. Extremities/Skin: Warm and dry. No clubbing or cyanosis. No edema. No rashes, wounds, or suspicious lesions. Monofilament exam normal.  Neuro: Alert and oriented X 3. Moves all extremities spontaneously. Gait is normal. CNII-XII grossly in tact. Psych:  Responds to questions appropriately with a normal affect.   Labs: Results for orders placed in visit on 07/31/14  GLUCOSE, POCT (MANUAL RESULT ENTRY)      Result Value Ref Range   POC Glucose 294 (*) 70 - 99 mg/dl  POCT  GLYCOSYLATED HEMOGLOBIN (HGB A1C)      Result Value Ref Range   Hemoglobin A1C 7.5    POCT URINALYSIS DIPSTICK      Result Value Ref Range   Color, UA yellow     Clarity, UA clear     Glucose, UA 500     Bilirubin, UA neg     Ketones, UA neg     Spec Grav, UA 1.010     Blood, UA neg     pH, UA 5.0     Protein, UA neg     Urobilinogen, UA 0.2     Nitrite, UA neg     Leukocytes, UA Negative        ASSESSMENT AND PLAN:  48 y.o. year old male with Other and unspecified hyperlipidemia - Plan: Comprehensive metabolic panel, Lipid panel  Type 2 diabetes mellitus with other specified complication - Plan: POCT glucose (manual entry), POCT glycosylated hemoglobin (Hb A1C), Microalbumin, urine, POCT urinalysis dipstick, Canagliflozin (INVOKANA) 300 MG TABS, metFORMIN (GLUCOPHAGE) 1000 MG tablet, DISCONTINUED: glipiZIDE (GLUCOTROL XL) 5 MG 24 hr tablet  CAD, multiple vessel -  Plan: atorvastatin (LIPITOR) 40 MG tablet, Canagliflozin (INVOKANA) 300 MG TABS, clopidogrel (PLAVIX) 75 MG tablet, metoprolol tartrate (LOPRESSOR) 25 MG tablet  Essential hypertension - Plan: clopidogrel (PLAVIX) 75 MG tablet, lisinopril-hydrochlorothiazide (PRINZIDE) 20-12.5 MG per tablet, metoprolol tartrate (LOPRESSOR) 25 MG tablet  SOB (shortness of breath) - Plan: clopidogrel (PLAVIX) 75 MG tablet  Need for prophylactic vaccination with Streptococcus pneumoniae (Pneumococcus) and Influenza vaccines - Plan: Flu Vaccine QUAD 36+ mos IM, Pneumococcal polysaccharide vaccine 23-valent greater than or equal to 2yo subcutaneous/IM   -  Signed, Elvina Sidle, MD 07/31/2014 10:08 AM

## 2014-08-01 ENCOUNTER — Other Ambulatory Visit: Payer: Self-pay | Admitting: Family Medicine

## 2014-08-01 DIAGNOSIS — E118 Type 2 diabetes mellitus with unspecified complications: Principal | ICD-10-CM

## 2014-08-01 DIAGNOSIS — IMO0002 Reserved for concepts with insufficient information to code with codable children: Secondary | ICD-10-CM

## 2014-08-01 DIAGNOSIS — E1165 Type 2 diabetes mellitus with hyperglycemia: Secondary | ICD-10-CM

## 2014-09-17 ENCOUNTER — Ambulatory Visit: Payer: BC Managed Care – PPO | Admitting: Interventional Cardiology

## 2014-09-27 ENCOUNTER — Encounter: Payer: Self-pay | Admitting: Interventional Cardiology

## 2014-10-18 ENCOUNTER — Encounter (HOSPITAL_COMMUNITY): Payer: Self-pay | Admitting: Interventional Cardiology

## 2014-12-31 ENCOUNTER — Other Ambulatory Visit: Payer: Self-pay

## 2014-12-31 MED ORDER — NITROGLYCERIN 0.4 MG SL SUBL: 0.4000 mg | SUBLINGUAL_TABLET | SUBLINGUAL | Status: DC | PRN

## 2015-01-01 ENCOUNTER — Encounter: Payer: Self-pay | Admitting: Nurse Practitioner

## 2015-01-01 ENCOUNTER — Ambulatory Visit (INDEPENDENT_AMBULATORY_CARE_PROVIDER_SITE_OTHER): Payer: 59 | Admitting: Nurse Practitioner

## 2015-01-01 VITALS — BP 140/70 | HR 87 | Ht 72.0 in | Wt 219.4 lb

## 2015-01-01 DIAGNOSIS — I25110 Atherosclerotic heart disease of native coronary artery with unstable angina pectoris: Secondary | ICD-10-CM

## 2015-01-01 DIAGNOSIS — Z720 Tobacco use: Secondary | ICD-10-CM

## 2015-01-01 DIAGNOSIS — E785 Hyperlipidemia, unspecified: Secondary | ICD-10-CM

## 2015-01-01 DIAGNOSIS — I10 Essential (primary) hypertension: Secondary | ICD-10-CM

## 2015-01-01 DIAGNOSIS — I5032 Chronic diastolic (congestive) heart failure: Secondary | ICD-10-CM

## 2015-01-01 MED ORDER — NITROGLYCERIN 0.4 MG SL SUBL: 0.4000 mg | SUBLINGUAL_TABLET | SUBLINGUAL | Status: DC | PRN

## 2015-01-01 NOTE — Patient Instructions (Addendum)
Stop smoking!  We will arrange for a stress test - stress Myoview  I refilled the NTG today to your drug store  Stay on your current medicines  [facility] office at [phone] if you have any questions, problems or concerns.

## 2015-01-01 NOTE — Progress Notes (Addendum)
CARDIOLOGY OFFICE NOTE  Date:  01/01/2015    Tristan Camacho Date of Birth: March 01, 1966 Medical Record #[id number]  PCP:  No PCP Per Patient  Cardiologist:  Katrinka Blazing    Chief Complaint  Patient presents with  . Chest Pain    Follow up visit - seen for Dr. Katrinka Blazing  . Coronary Artery Disease     History of Present Illness: Tristan Camacho is a 49 y.o. male who presents today for a follow up visit. Seen for Dr. Katrinka Blazing. He has known CAD with chronically occluded right coronary and previously stented LAD (2015). Other issues include exertional dyspnea, likely related to diastolic heart failure, HTN, HLD and DM.   Last seen here back in March of 2015. Was fatigued but felt to be stable.  Comes in today. Here with his wife. She translates for him. His English is sparse. Missed his 6 month check because he was in [place]. Has returned to smoking. Wife says he is smoking "alot" he says only 5 to 6 cigs a day. Describes a "chest pressing" in his chest for the past 2 to 3 weeks. This is not exertional. He has been a little dizzy. No NTG taken. Thought he should use that only for "pain". May be a little short of breath. Located over the left chest. Only lasts for a few seconds. One time it felt perhaps like his prior chest pain syndrome. Has had recent labs with his PCP. BP has been ok at home.   Past Medical History  Diagnosis Date  . Diabetes mellitus without complication   . GERD (gastroesophageal reflux disease)   . Hypertension   . CAD (coronary artery disease)     a. 12/2013 Cath/PCI: LM nl, LAD 90p (3.0x16 Promus Premier DES), LCX nl, RCA 100, r->r, l->r collats, EF 60% w/o rwma.  . Hyperlipidemia   . Tobacco abuse     Past Surgical History  Procedure Laterality Date  . Shoulder surgery Right   . Coronary stent placement    . Left heart catheterization with coronary angiogram Bilateral 12/20/2013    Procedure: LEFT HEART CATHETERIZATION WITH CORONARY ANGIOGRAM;  Surgeon: Lesleigh Noe,  MD;  Location: [facility] CATH LAB;  Service: Cardiovascular;  Laterality: Bilateral;     Medications: Current Outpatient Prescriptions  Medication Sig Dispense Refill  . aspirin EC 81 MG tablet Take 1 tablet (81 mg total) by mouth daily.    Marland Kitchen atorvastatin (LIPITOR) 40 MG tablet Take 1 tablet (40 mg total) by mouth daily at 6 PM. 90 tablet 3  . Canagliflozin (INVOKANA) 300 MG TABS Take 1 tablet (300 mg total) by mouth daily. 90 tablet 3  . clopidogrel (PLAVIX) 75 MG tablet Take 1 tablet (75 mg total) by mouth daily. 90 tablet 3  . lisinopril-hydrochlorothiazide (PRINZIDE) 20-12.5 MG per tablet Take 1 tablet by mouth daily. 90 tablet 3  . metFORMIN (GLUCOPHAGE) 1000 MG tablet Take 1 tablet (1,000 mg total) by mouth 2 (two) times daily with a meal. 180 tablet 3  . metoprolol tartrate (LOPRESSOR) 25 MG tablet Take 0.5 tablets (12.5 mg total) by mouth 2 (two) times daily. 180 tablet 3  . nitroGLYCERIN (NITROSTAT) 0.4 MG SL tablet Place 1 tablet (0.4 mg total) under the tongue every 5 (five) minutes x 3 doses as needed for chest pain. 25 tablet 3   No current facility-administered medications for this visit.    Allergies: No Known Allergies  Social History: The patient  reports that he has  quit smoking. He does not have any smokeless tobacco history on file. He reports that he does not drink alcohol or use illicit drugs.   Family History: The patient's family history includes Liver cancer in his father; Lung cancer in his paternal uncle.   Review of Systems: Please see the history of present illness.   Otherwise, the review of systems is positive for visual disturbance, DOE, dizziness, chest pressure and irregular heart beats.   All other systems are reviewed and negative.   Physical Exam: VS:  BP 140/70 mmHg  Pulse 87  Ht 6' (1.829 m)  Wt 219 lb 6.4 oz (99.519 kg)  BMI 29.75 kg/m2  SpO2 99% .  BMI Body mass index is 29.75 kg/(m^2).   BP is 110/70 by me.   Wt Readings from Last 3  Encounters:  01/01/15 219 lb 6.4 oz (99.519 kg)  07/31/14 220 lb (99.791 kg)  05/31/14 223 lb (101.152 kg)    General: Pleasant. Well developed, well nourished and in no acute distress.  HEENT: Normal. Neck: Supple, no JVD, carotid bruits, or masses noted.  Cardiac: Regular rate and rhythm. No murmurs, rubs, or gallops. No edema.  Respiratory:  Lungs are clear to auscultation bilaterally with normal work of breathing.  GI: Soft and nontender.  MS: No deformity or atrophy. Gait and ROM intact. Skin: Warm and dry. Color is normal.  Neuro:  Strength and sensation are intact and no gross focal deficits noted.  Psych: Alert, appropriate and with normal affect.   LABORATORY DATA:  EKG:  EKG is ordered today. This demonstrates NSR - unchanged from prior tracing. Has poor R wave progression and nonspecific ST changes.  Lab Results  Component Value Date   WBC 6.7 05/30/2014   HGB 17.0 05/30/2014   HCT 52.9 05/30/2014   PLT 234 12/21/2013   GLUCOSE 280* 07/31/2014   CHOL 178 07/31/2014   TRIG 281* 07/31/2014   HDL 34* 07/31/2014   LDLCALC 88 07/31/2014   ALT 18 07/31/2014   AST 15 07/31/2014   NA 136 07/31/2014   K 4.0 07/31/2014   CL 102 07/31/2014   CREATININE 0.75 07/31/2014   BUN 18 07/31/2014   CO2 27 07/31/2014   INR 1.01 12/20/2013   HGBA1C 7.5 07/31/2014   MICROALBUR 0.2 07/31/2014    BNP (last 3 results) No results for input(s): BNP in the last 8760 hours.  ProBNP (last 3 results) No results for input(s): PROBNP in the last 8760 hours.   Other Studies Reviewed Today:   PROCEDURE February 2015:  1. Left heart catheterization; 2. Coronary angiography; 3. Left ventriculography; 4. LAD DES  PROCEDURE TECHNIQUE: After Xylocaine anesthesia a 5 French Slender sheath was placed in the right radial artery with double wall stick using Angiocath. Coronary angiography was done using a 5 [place] JR 4 and JL 3.5 catheters. Left ventriculography was done using the 5  [place] JR 4 catheter and hand injection. 5000 units of heparin was administered intravenously after gaining access to the descending aorta.  Angiography demonstrated chronically occluded distal right coronary with left to right collaterals. This obstruction is felt to represent a chronic total occlusion perhaps 30 years old or older. The LAD demonstrated a very eccentric 85-95% proximal stenosis. This was felt to be the significant obstruction given the patient's presentation. After discussion with the patient via his wife who served as interpreter, we decided to proceed with PCI on the LAD.  An XB LAD 6 [place] guide catheter positioned and guiding  shots obtained. Bivalirudin bolus and infusion, weight-based, was administered and a therapeutic ACT was documented. A 190 cm 0.014 cougar wire was used to cross the stenosis in the LAD. A 3.0 x 12 mm Sprinter balloon was used for predilatation. We then positioned and deployed a 3.0 x 16 mm long Promus Premier deployed at nominal pressure. Postdilatation was then performed to 13 atmospheres with a 12 mm x 3.25 mm Blairs balloon. TIMI grade 3 flow was noted post final inflation. 100 mcg of intracoronary nitroglycerin was administered. At the completion of the procedure, and Aggrastat bolus was administered. Bivalirudin was discontinued.  HEMODYNAMICS: Aortic pressure 90/62 mmHg; LV pressure 90/1 mm mark; LVEDP 4 mmHg  ANGIOGRAPHIC DATA: The left main coronary artery is normal.  The left anterior descending artery is eccentric proximal 90% stenosis.  The left circumflex artery is widely patent and gives origin to 2 obtuse marginal branches..  The right coronary artery is totally occluded distally with both right to right and left-to-right collaterals noted. This is felt to represent a chronic total occlusion possibly many years duration.  PCI RESULTS: 90+ percent stenosis, highly eccentric in the proximal LAD reduced to 0% with TIMI grade 3 flow noted post  procedure. Complications  LEFT VENTRICULOGRAM: Left ventricular angiogram was done in the 30 RAO projection and revealed an LVEF 60% without regional wall motion abnormality.   IMPRESSIONS: 1. Unstable angina felt secondary to high-grade stenosis in the proximal LAD. 2. Successful stenting of the proximal LAD from 90% to 0% with TIMI grade 3 flow 3. 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 Overall normal left ventricular function  RECOMMENDATION: Aspirin and Brilinta for at least 12 months. Aggressive risk factor modification including management of diabetes, smoking cessation, weight loss, exercise, and blood pressure control. Eligible for discharge in a.m. if stable without complications      Assessment/Plan: 1. Known CAD - prior PCI to the LAD in February of 2015 -  Remains on DAPT  2. Chest pain - difficult to discern - in light of known CAD, return to smoking and other risk factors, will send for stress Myoview to make sure he does not have a high risk study - will probably have some abnormality given his totally occluded RCA. Further disposition to follow. NTG refilled today. Reminded in how to take. Patient is leaving back for [place] on Thursday and will be there for 6 weeks.   3. Tobacco abuse -  Encouraged to stop  4. HTN - recheck by me is ok.  5. HLD -  Has had recent labs with his PCP  6. DM - managed by his PCP.   Current medicines are reviewed with the patient today.  The patient does not have concerns regarding medicines other than what has been noted above.  The following changes have been made:  See above.  Labs/ tests ordered today include:    Orders Placed This Encounter  Procedures  . Myocardial Perfusion Imaging  . EKG 12-Lead     Disposition:   FU with Dr. Katrinka Blazing in 6 months unless Myoview is abnormal.   Patient is agreeable to this plan and  will call if any problems develop in the interim.   Signed: Rosalio Macadamia, RN, ANP-C 01/01/2015 2:09 PM  [facility] [address] [place], [place]  [address] Phone: [phone] Fax: [phone]  Addendum: 01/02/2015 Patient refused his nuclear study today due to cost/insurance reasons.   Rosalio MacadamiaLori C. Severus Brodzinski, RN, ANP-C [facility] [address] [place], [place]  [id number] [phone]

## 2015-01-02 ENCOUNTER — Ambulatory Visit (HOSPITAL_COMMUNITY): Payer: 59 | Attending: Nurse Practitioner

## 2015-01-02 DIAGNOSIS — I10 Essential (primary) hypertension: Secondary | ICD-10-CM

## 2015-01-02 DIAGNOSIS — I25110 Atherosclerotic heart disease of native coronary artery with unstable angina pectoris: Secondary | ICD-10-CM

## 2015-01-02 DIAGNOSIS — Z720 Tobacco use: Secondary | ICD-10-CM

## 2015-01-02 DIAGNOSIS — R0989 Other specified symptoms and signs involving the circulatory and respiratory systems: Secondary | ICD-10-CM

## 2015-01-02 DIAGNOSIS — I5032 Chronic diastolic (congestive) heart failure: Secondary | ICD-10-CM

## 2015-01-02 DIAGNOSIS — E785 Hyperlipidemia, unspecified: Secondary | ICD-10-CM

## 2015-05-29 ENCOUNTER — Ambulatory Visit (INDEPENDENT_AMBULATORY_CARE_PROVIDER_SITE_OTHER): Payer: 59 | Admitting: Nurse Practitioner

## 2015-05-29 ENCOUNTER — Encounter: Payer: Self-pay | Admitting: Nurse Practitioner

## 2015-05-29 VITALS — BP 110/68 | HR 99 | Ht 72.0 in | Wt 222.0 lb

## 2015-05-29 DIAGNOSIS — Z720 Tobacco use: Secondary | ICD-10-CM | POA: Diagnosis not present

## 2015-05-29 DIAGNOSIS — I5032 Chronic diastolic (congestive) heart failure: Secondary | ICD-10-CM | POA: Diagnosis not present

## 2015-05-29 DIAGNOSIS — I2510 Atherosclerotic heart disease of native coronary artery without angina pectoris: Secondary | ICD-10-CM

## 2015-05-29 DIAGNOSIS — I10 Essential (primary) hypertension: Secondary | ICD-10-CM

## 2015-05-29 MED ORDER — NITROGLYCERIN 0.4 MG SL SUBL: 0.4000 mg | SUBLINGUAL_TABLET | SUBLINGUAL | Status: DC | PRN

## 2015-05-29 NOTE — Progress Notes (Signed)
CARDIOLOGY OFFICE NOTE  Date:  05/29/2015    Tristan Camacho Date of Birth: 04/02/1966 Medical Record #[id number]  PCP:  Dorrene German, MD  Cardiologist:  Katrinka Blazing    Chief Complaint  Patient presents with  . Coronary Artery Disease    5 month check - seen for Dr. Katrinka Blazing    History of Present Illness: Tristan Camacho is a 49 y.o. male who presents today for a 5 month check. Seen for Dr. Katrinka Blazing. He has known CAD with chronically occluded right coronary and previously stented LAD (2015). Other issues include exertional dyspnea, likely related to diastolic heart failure, HTN, HLD and uncontrolled DM.   Last seen here back in March of 2015 by Dr. Katrinka Blazing. Was fatigued but felt to be stable.  I saw him back in February after he had missed his 6 month follow up. He was smoking. Some atypical chest pain. I tried to get a Myoview but the patient did not want to pay due to high deductible plan and was planning on returning to [place].   Comes in today. Here with his wife. She translates for him. His English is sparse. Saw his PCP last month and was referred back here for follow up. Seems to be doing ok. Notes he has a "little press" as she rubs her chest and says a "little short of breath". Not really different from before. No exercise. Continues to smoke. Does not seem to really be engaged with taking care of himself.   Past Medical History  Diagnosis Date  . Diabetes mellitus without complication   . GERD (gastroesophageal reflux disease)   . Hypertension   . CAD (coronary artery disease)     a. 12/2013 Cath/PCI: LM nl, LAD 90p (3.0x16 Promus Premier DES), LCX nl, RCA 100, r->r, l->r collats, EF 60% w/o rwma.  . Hyperlipidemia   . Tobacco abuse     Past Surgical History  Procedure Laterality Date  . Shoulder surgery Right   . Coronary stent placement    . Left heart catheterization with coronary angiogram Bilateral 12/20/2013    Procedure: LEFT HEART CATHETERIZATION WITH CORONARY ANGIOGRAM;   Surgeon: Lesleigh Noe, MD;  Location: [facility] CATH LAB;  Service: Cardiovascular;  Laterality: Bilateral;     Medications: Current Outpatient Prescriptions  Medication Sig Dispense Refill  . aspirin EC 81 MG tablet Take 1 tablet (81 mg total) by mouth daily.    . cholecalciferol (VITAMIN D) 1000 UNITS tablet Take 1,000 Units by mouth daily.    . ciprofloxacin (CIPRO) 500 MG tablet Take 500 mg by mouth every 12 (twelve) hours.    Marland Kitchen lisinopril (PRINIVIL,ZESTRIL) 20 MG tablet Take 20 mg by mouth daily.    . metFORMIN (GLUCOPHAGE) 1000 MG tablet Take 1 tablet (1,000 mg total) by mouth 2 (two) times daily with a meal. 180 tablet 3  . nitroGLYCERIN (NITROSTAT) 0.4 MG SL tablet Place 1 tablet (0.4 mg total) under the tongue every 5 (five) minutes x 3 doses as needed for chest pain. 25 tablet 3  . omeprazole (PRILOSEC) 20 MG capsule Take 20 mg by mouth 2 (two) times daily before a meal.    . tadalafil (CIALIS) 5 MG tablet Take 5 mg by mouth daily as needed for erectile dysfunction (erectile dysfunction).    . tamsulosin (FLOMAX) 0.4 MG CAPS capsule Take 0.4 mg by mouth daily after supper.    . vitamin C (ASCORBIC ACID) 500 MG tablet Take 500 mg by mouth daily.  No current facility-administered medications for this visit.    Allergies: No Known Allergies  Social History: The patient  reports that he has been smoking.  He does not have any smokeless tobacco history on file. He reports that he does not drink alcohol or use illicit drugs.   Family History: The patient's family history includes Liver cancer in his father; Lung cancer in his paternal uncle.   Review of Systems: Please see the history of present illness.   Otherwise, the review of systems is positive for none.   All other systems are reviewed and negative.   Physical Exam: VS:  BP 110/68 mmHg  Pulse 99  Ht 6' (1.829 m)  Wt 222 lb (100.699 kg)  BMI 30.10 kg/m2  SpO2 98% .  BMI Body mass index is 30.1 kg/(m^2).  Wt  Readings from Last 3 Encounters:  05/29/15 222 lb (100.699 kg)  01/01/15 219 lb 6.4 oz (99.519 kg)  07/31/14 220 lb (99.791 kg)    General: Pleasant. Well developed, well nourished and in no acute distress.  HEENT: Normal. Neck: Supple, no JVD, carotid bruits, or masses noted.  Cardiac: Regular rate and rhythm. Heart rate a little fast today. No murmurs, rubs, or gallops. No edema.  Respiratory:  Lungs are clear to auscultation bilaterally with normal work of breathing.  GI: Soft and nontender.  MS: No deformity or atrophy. Gait and ROM intact. Skin: Warm and dry. Color is normal.  Neuro:  Strength and sensation are intact and no gross focal deficits noted.  Psych: Alert, appropriate and with normal affect.   LABORATORY DATA:  EKG:  EKG is not ordered today.   Lab Results  Component Value Date   WBC 6.7 05/30/2014   HGB 17.0 05/30/2014   HCT 52.9 05/30/2014   PLT 234 12/21/2013   GLUCOSE 280* 07/31/2014   CHOL 178 07/31/2014   TRIG 281* 07/31/2014   HDL 34* 07/31/2014   LDLCALC 88 07/31/2014   ALT 18 07/31/2014   AST 15 07/31/2014   NA 136 07/31/2014   K 4.0 07/31/2014   CL 102 07/31/2014   CREATININE 0.75 07/31/2014   BUN 18 07/31/2014   CO2 27 07/31/2014   INR 1.01 12/20/2013   HGBA1C 7.5 07/31/2014   MICROALBUR 0.2 07/31/2014    BNP (last 3 results) No results for input(s): BNP in the last 8760 hours.  ProBNP (last 3 results) No results for input(s): PROBNP in the last 8760 hours.   Other Studies Reviewed Today:  PROCEDURE February 2015:  1. Left heart catheterization; 2. Coronary angiography; 3. Left ventriculography; 4. LAD DES  PROCEDURE TECHNIQUE: After Xylocaine anesthesia a 5 French Slender sheath was placed in the right radial artery with double wall stick using Angiocath. Coronary angiography was done using a 5 [place] JR 4 and JL 3.5 catheters. Left ventriculography was done using the 5 [place] JR 4 catheter and hand injection. 5000 units of  heparin was administered intravenously after gaining access to the descending aorta.  Angiography demonstrated chronically occluded distal right coronary with left to right collaterals. This obstruction is felt to represent a chronic total occlusion perhaps 76 years old or older. The LAD demonstrated a very eccentric 85-95% proximal stenosis. This was felt to be the significant obstruction given the patient's presentation. After discussion with the patient via his wife who served as interpreter, we decided to proceed with PCI on the LAD.  An XB LAD 6 [place] guide catheter positioned and guiding shots obtained. Bivalirudin bolus  and infusion, weight-based, was administered and a therapeutic ACT was documented. A 190 cm 0.014 cougar wire was used to cross the stenosis in the LAD. A 3.0 x 12 mm Sprinter balloon was used for predilatation. We then positioned and deployed a 3.0 x 16 mm long Promus Premier deployed at nominal pressure. Postdilatation was then performed to 13 atmospheres with a 12 mm x 3.25 mm [place] balloon. TIMI grade 3 flow was noted post final inflation. 100 mcg of intracoronary nitroglycerin was administered. At the completion of the procedure, and Aggrastat bolus was administered. Bivalirudin was discontinued.  HEMODYNAMICS: Aortic pressure 90/62 mmHg; LV pressure 90/1 mm mark; LVEDP 4 mmHg  ANGIOGRAPHIC DATA: The left main coronary artery is normal.  The left anterior descending artery is eccentric proximal 90% stenosis.  The left circumflex artery is widely patent and gives origin to 2 obtuse marginal branches..  The right coronary artery is totally occluded distally with both right to right and left-to-right collaterals noted. This is felt to represent a chronic total occlusion possibly many years duration.  PCI RESULTS: 90+ percent stenosis, highly eccentric in the proximal LAD reduced to 0% with TIMI grade 3 flow noted post procedure. Complications  LEFT VENTRICULOGRAM: Left  ventricular angiogram was done in the 30 RAO projection and revealed an LVEF 60% without regional wall motion abnormality.   IMPRESSIONS: 1. Unstable angina felt secondary to high-grade stenosis in the proximal LAD. 2. Successful stenting of the proximal LAD from 90% to 0% with TIMI grade 3 flow 3. 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 Overall normal left ventricular function  RECOMMENDATION: Aspirin and Brilinta for at least 12 months. Aggressive risk factor modification including management of diabetes, smoking cessation, weight loss, exercise, and blood pressure control. Eligible for discharge in a.m. if stable without complications    Assessment/Plan: 1. Known CAD - prior PCI to the LAD in February of 2015 - Just on aspirin therapy now.   2. Chest pain - difficult to discern - in light of known CAD, return to smoking and other risk factors will try to arrange for GXT - cannot afford Myoview.  NTG refilled today and he is not using. Needs to work on his risk factors - I do not see this changing unfortunately. EKG today.   3. Tobacco abuse - Encouraged to stop  4. HTN - BP ok on current regimen.  5. HLD - lab by PCP  6. DM - managed by his PCP.   Current medicines are reviewed with the patient today.  The patient does not have concerns regarding medicines other than what has been noted above.  The following changes have been made:  See above.  Labs/ tests ordered today include:    Orders Placed This Encounter  Procedures  . Exercise Tolerance Test  . EKG 12-Lead     Disposition:   FU with Dr. Katrinka BlazingSmith in 6 months.   Patient is agreeable to this plan and will call if any problems develop in the interim.   Signed: Rosalio MacadamiaLori C. Tavonte Seybold, RN, ANP-C 05/29/2015 9:38 AM  [facility] [address] [place], [place]   [id number] Phone: [phone] Fax: [phone]

## 2015-05-29 NOTE — Patient Instructions (Addendum)
We will be checking the following labs today - NONE   Medication Instructions:    Continue with your current medicines.     Testing/Procedures To Be Arranged:  GXT  Follow-Up:   See Dr. Katrinka BlazingSmith in 6 months.     Other Special Instructions:   N/A  Call the  [facility] office at [phone] if you have any questions, problems or concerns.

## 2015-07-10 ENCOUNTER — Encounter: Payer: 59 | Admitting: Physician Assistant

## 2015-07-10 ENCOUNTER — Ambulatory Visit: Payer: 59

## 2015-07-19 ENCOUNTER — Telehealth: Payer: Self-pay | Admitting: *Deleted

## 2015-07-19 NOTE — Telephone Encounter (Signed)
Pt wife stated cancelled gxt because one of our staff members stated the GXT was a 5000.00 test.  I stated GXT cost 310.00.  Pt stated wanted to reschedule. I sent message to scheduling to call pt to rescheduled GXT.  Pt stated great appreciation.

## 2015-08-07 ENCOUNTER — Other Ambulatory Visit: Payer: Self-pay | Admitting: Family Medicine

## 2015-08-07 ENCOUNTER — Ambulatory Visit (INDEPENDENT_AMBULATORY_CARE_PROVIDER_SITE_OTHER): Payer: 59 | Admitting: Nurse Practitioner

## 2015-08-07 DIAGNOSIS — I5032 Chronic diastolic (congestive) heart failure: Secondary | ICD-10-CM

## 2015-08-07 DIAGNOSIS — I10 Essential (primary) hypertension: Secondary | ICD-10-CM

## 2015-08-07 DIAGNOSIS — I2510 Atherosclerotic heart disease of native coronary artery without angina pectoris: Secondary | ICD-10-CM | POA: Diagnosis not present

## 2015-08-07 DIAGNOSIS — Z720 Tobacco use: Secondary | ICD-10-CM

## 2015-08-07 LAB — EXERCISE TOLERANCE TEST
Estimated workload: 11.7 METS
Exercise duration (min): 10 min
Exercise duration (sec): 0 s
MPHR: 169 {beats}/min
Peak HR: 157 {beats}/min
Percent HR: 92 %
RPE: 15
Rest HR: 89 {beats}/min

## 2015-08-15 ENCOUNTER — Other Ambulatory Visit: Payer: Self-pay | Admitting: Family Medicine

## 2015-12-25 ENCOUNTER — Other Ambulatory Visit: Payer: Self-pay | Admitting: Family Medicine

## 2016-01-20 ENCOUNTER — Ambulatory Visit (INDEPENDENT_AMBULATORY_CARE_PROVIDER_SITE_OTHER): Payer: BLUE CROSS/BLUE SHIELD | Admitting: Emergency Medicine

## 2016-01-20 ENCOUNTER — Encounter: Payer: Self-pay | Admitting: Gastroenterology

## 2016-01-20 VITALS — BP 110/78 | HR 83 | Temp 98.4°F | Resp 16 | Ht 72.0 in | Wt 232.0 lb

## 2016-01-20 DIAGNOSIS — Z0000 Encounter for general adult medical examination without abnormal findings: Secondary | ICD-10-CM

## 2016-01-20 DIAGNOSIS — E1169 Type 2 diabetes mellitus with other specified complication: Secondary | ICD-10-CM | POA: Diagnosis not present

## 2016-01-20 DIAGNOSIS — I10 Essential (primary) hypertension: Secondary | ICD-10-CM

## 2016-01-20 DIAGNOSIS — Z125 Encounter for screening for malignant neoplasm of prostate: Secondary | ICD-10-CM | POA: Diagnosis not present

## 2016-01-20 DIAGNOSIS — Z1211 Encounter for screening for malignant neoplasm of colon: Secondary | ICD-10-CM

## 2016-01-20 DIAGNOSIS — Z23 Encounter for immunization: Secondary | ICD-10-CM

## 2016-01-20 DIAGNOSIS — Z1159 Encounter for screening for other viral diseases: Secondary | ICD-10-CM

## 2016-01-20 DIAGNOSIS — I2583 Coronary atherosclerosis due to lipid rich plaque: Secondary | ICD-10-CM

## 2016-01-20 DIAGNOSIS — I2510 Atherosclerotic heart disease of native coronary artery without angina pectoris: Secondary | ICD-10-CM | POA: Diagnosis not present

## 2016-01-20 LAB — LIPID PANEL
Cholesterol: 136 mg/dL (ref 125–200)
HDL: 33 mg/dL — ABNORMAL LOW (ref >=40)
LDL CALC: 65 mg/dL (ref <130)
TRIGLYCERIDES: 192 mg/dL — AB (ref <150)
Total CHOL/HDL Ratio: 4.1 Ratio (ref <=5.0)
VLDL: 38 mg/dL — AB (ref <30)

## 2016-01-20 LAB — POCT URINALYSIS DIP (MANUAL ENTRY)
BILIRUBIN UA: NEGATIVE
Bilirubin, UA: NEGATIVE
Blood, UA: NEGATIVE
Glucose, UA: NEGATIVE
Leukocytes, UA: NEGATIVE
Nitrite, UA: NEGATIVE
PH UA: 7
Protein Ur, POC: NEGATIVE
SPEC GRAV UA: 1.02
Urobilinogen, UA: 4

## 2016-01-20 LAB — COMPLETE METABOLIC PANEL WITH GFR
ALBUMIN: 3.9 g/dL (ref 3.6–5.1)
ALK PHOS: 54 U/L (ref 40–115)
ALT: 18 U/L (ref 9–46)
AST: 15 U/L (ref 10–35)
BILIRUBIN TOTAL: 0.6 mg/dL (ref 0.2–1.2)
BUN: 16 mg/dL (ref 7–25)
CALCIUM: 8.8 mg/dL (ref 8.6–10.3)
CHLORIDE: 103 mmol/L (ref 98–110)
CO2: 25 mmol/L (ref 20–31)
CREATININE: 0.49 mg/dL — AB (ref 0.70–1.33)
GFR, Est Non African American: >89 mL/min (ref >=60)
Glucose, Bld: 176 mg/dL — ABNORMAL HIGH (ref 65–99)
Potassium: 4.1 mmol/L (ref 3.5–5.3)
Sodium: 136 mmol/L (ref 135–146)
Total Protein: 6.5 g/dL (ref 6.1–8.1)

## 2016-01-20 LAB — HEPATITIS C ANTIBODY: HCV AB: NEGATIVE

## 2016-01-20 LAB — POCT CBC
GRANULOCYTE PERCENT: 70.9 % (ref 37–80)
HCT, POC: 44.1 % (ref 43.5–53.7)
HEMOGLOBIN: 15.5 g/dL (ref 14.1–18.1)
Lymph, poc: 1.2 (ref 0.6–3.4)
MCH: 30.9 pg (ref 27–31.2)
MCHC: 35.1 g/dL (ref 31.8–35.4)
MCV: 88 fL (ref 80–97)
MID (cbc): 0.3 (ref 0–0.9)
MPV: 6.5 fL (ref 0–99.8)
POC Granulocyte: 3.8 (ref 2–6.9)
POC LYMPH PERCENT: 22.6 %L (ref 10–50)
POC MID %: 6.5 %M (ref 0–12)
Platelet Count, POC: 263 10*3/uL (ref 142–424)
RBC: 5.02 M/uL (ref 4.69–6.13)
RDW, POC: 12.3 %
WBC: 5.3 10*3/uL (ref 4.6–10.2)

## 2016-01-20 LAB — MICROALBUMIN, URINE: MICROALB UR: 1.1 mg/dL

## 2016-01-20 LAB — GLUCOSE, POCT (MANUAL RESULT ENTRY): POC Glucose: 183 mg/dl — AB (ref 70–99)

## 2016-01-20 LAB — POCT GLYCOSYLATED HEMOGLOBIN (HGB A1C): Hemoglobin A1C: 8

## 2016-01-20 LAB — TSH: TSH: 1.43 m[IU]/L (ref 0.40–4.50)

## 2016-01-20 MED ORDER — OMEPRAZOLE 20 MG PO CPDR: 20.0000 mg | DELAYED_RELEASE_CAPSULE | Freq: Two times a day (BID) | ORAL | Status: DC

## 2016-01-20 MED ORDER — CANAGLIFLOZIN 300 MG PO TABS: 300.0000 mg | ORAL_TABLET | Freq: Every day | ORAL | Status: AC

## 2016-01-20 MED ORDER — LOSARTAN POTASSIUM-HCTZ 100-12.5 MG PO TABS
1.0000 | ORAL_TABLET | Freq: Every day | ORAL | Status: DC
Start: 2016-01-20 — End: 2016-11-29

## 2016-01-20 MED ORDER — METFORMIN HCL 1000 MG PO TABS: 1000.0000 mg | ORAL_TABLET | Freq: Two times a day (BID) | ORAL | Status: DC

## 2016-01-20 MED ORDER — TAMSULOSIN HCL 0.4 MG PO CAPS: 0.4000 mg | ORAL_CAPSULE | Freq: Every day | ORAL | Status: AC

## 2016-01-20 NOTE — Patient Instructions (Addendum)
IF you received an x-ray today, you will receive an invoice from [facility]. Please contact [facility] at [phone] with questions or concerns regarding your invoice.   IF you received labwork today, you will receive an invoice from [facility]. Please contact [facility] at [phone] with questions or concerns regarding your invoice.   Our billing staff will not be able to assist you with questions regarding bills from these companies.  You will be contacted with the lab results as soon as they are available. The fastest way to get your results is to activate your My Chart account. Instructions are located on the last page of this paperwork. If you have not heard from us regarding the results in 2 weeks, please contact this office.  I have made you an appointment to see the diabetes specialist. I will make you an appointment to see the eye doctor to check your eyes for diabetes changes. You must decrease her alcohol intake and stop smoking. Referral made to gastroenterology for your colonoscopy.

## 2016-01-20 NOTE — Progress Notes (Addendum)
Subjective:  This chart was scribed for Lesle Chris MD,  by Veverly Fells, at [facility].  This patient was seen in room 13 and the patient's care was started at 10:00 AM.    Patient ID: Tristan Camacho, male    DOB: 06/08/1966, 50 y.o.   MRN: [id number] Chief Complaint  Patient presents with  . Annual Exam  . Medication Refill    invokana, lisinopril  . Medication Problem    wants to change prilosec    HPI  HPI Comments: Tristan Camacho is a 50 y.o. male who presents to the [facility] for an annual physical exam as well as medication refill.  Patient would like to be established here for primary care but is willing to stay at current location as he cannot establish with a provider here at the moment. Patient is a smoker and drinks half a bottle of 20% alchohol per day.     Screening/Vaccination: He is currently seeing a cardiologist once a year.  He received a stress test which did not show any concerns. He has not yet received a colonoscopy.  Patient would like a pneumonia vaccination today.   Diabetes/Medications: His sugar runs around 150 and when compliant with his medication, it runs around 120. Per wife, patient has not yet seen an endocrinologist. He stopped taking his Invokana 1 month ago and is compliant with his metformin.  He is coughing often and thinks it may be due to the Lisinopril.  He has not yet seen an eye doctor.  Patient has had a history of diabetes for the past 15 years.  He does not feel that the Prilosec he is taking is working well.   Patient was admitted to the ED in 2015 for a cardiac catheterization and stenting of his LAD.    Patient Active Problem List   Diagnosis Date Noted  . Chronic diastolic heart failure (HCC) 01/25/2014  . CAD (coronary artery disease) 12/21/2013  . Tobacco abuse 12/21/2013  . Hyperlipidemia   . Diabetes mellitus type II, uncontrolled (HCC) 12/19/2013  . HTN (hypertension) 12/19/2013  .  Unstable angina pectoris (HCC) 12/19/2013   Past Medical History  Diagnosis Date  . Diabetes mellitus without complication (HCC)   . GERD (gastroesophageal reflux disease)   . Hypertension   . CAD (coronary artery disease)     a. 12/2013 Cath/PCI: LM nl, LAD 90p (3.0x16 Promus Premier DES), LCX nl, RCA 100, r->r, l->r collats, EF 60% w/o rwma.  . Hyperlipidemia   . Tobacco abuse    Past Surgical History  Procedure Laterality Date  . Shoulder surgery Right   . Coronary stent placement    . Left heart catheterization with coronary angiogram Bilateral 12/20/2013    Procedure: LEFT HEART CATHETERIZATION WITH CORONARY ANGIOGRAM;  Surgeon: Lesleigh Noe, MD;  Location: [facility] CATH LAB;  Service: Cardiovascular;  Laterality: Bilateral;   No Known Allergies Prior to Admission medications   Medication Sig Start Date End Date Taking? Authorizing Provider  aspirin EC 81 MG tablet Take 1 tablet (81 mg total) by mouth daily. 02/22/14  Yes Lyn Records, MD  canagliflozin (INVOKANA) 300 MG TABS tablet Take 300 mg by mouth daily. PATIENT NEEDS OFFICE VISIT FOR ADDITIONAL REFILLS 08/16/15  Yes Elvina Sidle, MD  lisinopril (PRINIVIL,ZESTRIL) 20 MG tablet Take 20 mg by mouth daily.   Yes Historical Provider, MD  lisinopril-hydrochlorothiazide (PRINZIDE,ZESTORETIC) 20-12.5 MG tablet Take 1 tablet by mouth daily. PATIENT  NEEDS OFFICE VISIT FOR ADDITIONAL REFILLS 08/16/15  Yes Elvina Sidle, MD  metFORMIN (GLUCOPHAGE) 1000 MG tablet Take 1 tablet (1,000 mg total) by mouth 2 (two) times daily with a meal. 07/31/14  Yes Elvina Sidle, MD  nitroGLYCERIN (NITROSTAT) 0.4 MG SL tablet Place 1 tablet (0.4 mg total) under the tongue every 5 (five) minutes x 3 doses as needed for chest pain. 05/29/15  Yes Rosalio Macadamia, NP  omeprazole (PRILOSEC) 20 MG capsule Take 20 mg by mouth 2 (two) times daily before a meal. Reported on 01/20/2016   Yes Historical Provider, MD  vitamin C (ASCORBIC ACID) 500 MG tablet Take 500  mg by mouth daily.   Yes Historical Provider, MD  cholecalciferol (VITAMIN D) 1000 UNITS tablet Take 1,000 Units by mouth daily. Reported on 01/20/2016    Historical Provider, MD  tadalafil (CIALIS) 5 MG tablet Take 5 mg by mouth daily as needed for erectile dysfunction (erectile dysfunction). Reported on 01/20/2016    Historical Provider, MD  tamsulosin (FLOMAX) 0.4 MG CAPS capsule Take 0.4 mg by mouth daily after supper. Reported on 01/20/2016    Historical Provider, MD   Social History   Social History  . Marital Status: Married    Spouse Name: N/A  . Number of Children: N/A  . Years of Education: N/A   Occupational History  . Not on file.   Social History Main Topics  . Smoking status: Current Every Day Smoker  . Smokeless tobacco: Not on file  . Alcohol Use: No  . Drug Use: No  . Sexual Activity: Yes   Other Topics Concern  . Not on file   Social History Narrative    Review of Systems  Constitutional: Negative for fever and chills.  Eyes: Negative for pain and redness.  Respiratory: Negative for cough, choking and shortness of breath.   Gastrointestinal: Negative for nausea and vomiting.  Musculoskeletal: Negative for neck pain and neck stiffness.  Neurological: Negative for syncope and speech difficulty.       Objective:   Physical Exam  Filed Vitals:   01/20/16 1001  BP: 110/78  Pulse: 83  Temp: 98.4 F (36.9 C)  Resp: 16  Height: 6' (1.829 m)  Weight: 232 lb (105.235 kg)  SpO2: 98%    CONSTITUTIONAL: Well developed/well nourished HEAD: Normocephalic/atraumatic EYES: EOMI/PERRL ENMT: Mucous membranes moist NECK: supple no meningeal signs SPINE/BACK:entire spine nontender CV: S1/S2 noted, no murmurs/rubs/gallops noted LUNGS: Lungs are clear to auscultation bilaterally, no apparent distress ABDOMEN: multiple stria on lower abdomen.  GU:no cva tenderness NEURO: Pt is awake/alert/appropriate, moves all extremitiesx4.  No facial droop.   EXTREMITIES:  pulses 2+, no ulcerations on feet.  SKIN: significant acne on his face.  PSYCH: no abnormalities of mood noted, alert and oriented to situation Results for orders placed or performed in visit on 01/20/16  POCT CBC  Result Value Ref Range   WBC 5.3 4.6 - 10.2 K/uL   Lymph, poc 1.2 0.6 - 3.4   POC LYMPH PERCENT 22.6 10 - 50 %L   MID (cbc) 0.3 0 - 0.9   POC MID % 6.5 0 - 12 %M   POC Granulocyte 3.8 2 - 6.9   Granulocyte percent 70.9 37 - 80 %G   RBC 5.02 4.69 - 6.13 M/uL   Hemoglobin 15.5 14.1 - 18.1 g/dL   HCT, POC 16.1 09.6 - 53.7 %   MCV 88.0 80 - 97 fL   MCH, POC 30.9 27 - 31.2 pg  MCHC 35.1 31.8 - 35.4 g/dL   RDW, POC 82.912.3 %   Platelet Count, POC 263 142 - 424 K/uL   MPV 6.5 0 - 99.8 fL  POCT urinalysis dipstick  Result Value Ref Range   Color, UA yellow yellow   Clarity, UA clear clear   Glucose, UA negative negative   Bilirubin, UA negative negative   Ketones, POC UA negative negative   Spec Grav, UA 1.020    Blood, UA negative negative   pH, UA 7.0    Protein Ur, POC negative negative   Urobilinogen, UA 4.0    Nitrite, UA Negative Negative   Leukocytes, UA Negative Negative  POCT glycosylated hemoglobin (Hb A1C)  Result Value Ref Range   Hemoglobin A1C 8.0   POCT glucose (manual entry)  Result Value Ref Range   POC Glucose 183 (A) 70 - 99 mg/dl      Assessment & Plan:  Meds were refilled. I changed his lisinopril to losartan because of issues with cough. I refilled his diabetes medications. He has been off of in bulk on for one month. Referral made to GI for colonoscopy referral made to ophthalmology for a good eye check. Referral made to endocrinology to get better control of his diabetes.I personally performed the services described in this documentation, which was scribed in my presence. The recorded information has been reviewed and is accurate. Pneumovax 23 was given today.  This patient is at high risk for complications from his diabetes. He already has  known coronary artery disease. He is continuing to smoke and drink alcohol. He has been out of his medication for the last month. He has never seen an eye doctor and has not had a colonoscopy. He declines a flu shot last year but is agreeable to take the pneumonia vaccine today. Referral made to endocrinology. Routine labs were done today.

## 2016-01-21 LAB — PSA: PSA: 0.4 ng/mL (ref <=4.00)

## 2016-02-18 LAB — HM DIABETES EYE EXAM

## 2016-03-10 ENCOUNTER — Ambulatory Visit: Payer: Self-pay | Admitting: Gastroenterology

## 2016-03-18 ENCOUNTER — Telehealth: Payer: Self-pay | Admitting: Nurse Practitioner

## 2016-03-18 NOTE — Telephone Encounter (Signed)
error 

## 2016-03-18 NOTE — Telephone Encounter (Signed)
New message     The wife is calling about what medications the patient on; the [place] dentist have dental work done the wife is calling cause she wants to know if he is on a blood thinner. Please call the wife back at the number provided.

## 2016-03-19 ENCOUNTER — Encounter (INDEPENDENT_AMBULATORY_CARE_PROVIDER_SITE_OTHER): Payer: Self-pay

## 2016-03-19 NOTE — Telephone Encounter (Signed)
Patient's wife in the office to request cardiac clearance to have 3 wisdom teeth pulled in [place] tomorrow.  She does not know if the procedure will be under general or local anesthesia.  She st the patient has already held his ASA for 10 days for the procedure. She asked that the request be emailed to the patient, and was told that personal health information cannot be sent via email. Gave her MyChart instructions and told her that IF clearance is granted, it will be sent in a MyChart message for the patient to pull for dentist. Patient's wife was grateful for time spent.

## 2016-03-20 NOTE — Telephone Encounter (Signed)
Note of clearance sent via MyChart.

## 2016-03-20 NOTE — Telephone Encounter (Signed)
Patient had negative GXT back in September.  No recent follow up visit.   If not having symptoms, should be ok to proceed.  Instructions for aspirin have apparently already been given.

## 2016-03-21 NOTE — Telephone Encounter (Signed)
Agree. Thanks

## 2016-07-16 ENCOUNTER — Other Ambulatory Visit: Payer: Self-pay | Admitting: Orthopedic Surgery

## 2016-08-14 ENCOUNTER — Encounter (HOSPITAL_BASED_OUTPATIENT_CLINIC_OR_DEPARTMENT_OTHER): Payer: Self-pay | Admitting: *Deleted

## 2016-08-18 ENCOUNTER — Encounter (HOSPITAL_BASED_OUTPATIENT_CLINIC_OR_DEPARTMENT_OTHER)
Admission: RE | Admit: 2016-08-18 | Discharge: 2016-08-18 | Disposition: A | Discharge status: Home / Self Care | Payer: Self-pay | Source: Ambulatory Visit | Attending: Orthopedic Surgery | Admitting: Orthopedic Surgery

## 2016-08-18 DIAGNOSIS — E119 Type 2 diabetes mellitus without complications: Secondary | ICD-10-CM | POA: Insufficient documentation

## 2016-08-18 DIAGNOSIS — I10 Essential (primary) hypertension: Secondary | ICD-10-CM | POA: Insufficient documentation

## 2016-08-18 LAB — BASIC METABOLIC PANEL
ANION GAP: 9 (ref 5–15)
BUN: 14 mg/dL (ref 6–20)
CO2: 23 mmol/L (ref 22–32)
CREATININE: 0.7 mg/dL (ref 0.61–1.24)
Calcium: 9.3 mg/dL (ref 8.9–10.3)
Chloride: 106 mmol/L (ref 101–111)
GFR calc non Af Amer: >60 mL/min (ref >60)
Glucose, Bld: 205 mg/dL — ABNORMAL HIGH (ref 65–99)
POTASSIUM: 3.9 mmol/L (ref 3.5–5.1)
SODIUM: 138 mmol/L (ref 135–145)

## 2016-08-18 NOTE — Progress Notes (Signed)
EKG reviewed by Dr. Glade Stanford. Fitzgerald, will proceed with surgery. Instructions given not to take diabetic meds dos and not to smoke 24hours before surgery.  Pt verbalized understanding.

## 2016-08-24 ENCOUNTER — Encounter (HOSPITAL_BASED_OUTPATIENT_CLINIC_OR_DEPARTMENT_OTHER): Payer: Self-pay | Admitting: Certified Registered"

## 2016-08-24 ENCOUNTER — Ambulatory Visit (HOSPITAL_BASED_OUTPATIENT_CLINIC_OR_DEPARTMENT_OTHER): Payer: BLUE CROSS/BLUE SHIELD | Admitting: Certified Registered"

## 2016-08-24 ENCOUNTER — Encounter (HOSPITAL_BASED_OUTPATIENT_CLINIC_OR_DEPARTMENT_OTHER)
Admission: RE | Disposition: A | Discharge status: Home / Self Care | Payer: Self-pay | Source: Ambulatory Visit | Attending: Orthopedic Surgery

## 2016-08-24 ENCOUNTER — Ambulatory Visit (HOSPITAL_BASED_OUTPATIENT_CLINIC_OR_DEPARTMENT_OTHER)
Admission: RE | Admit: 2016-08-24 | Discharge: 2016-08-24 | Disposition: A | Discharge status: Home / Self Care | Payer: BLUE CROSS/BLUE SHIELD | Source: Ambulatory Visit | Attending: Orthopedic Surgery | Admitting: Orthopedic Surgery

## 2016-08-24 DIAGNOSIS — Z801 Family history of malignant neoplasm of trachea, bronchus and lung: Secondary | ICD-10-CM | POA: Insufficient documentation

## 2016-08-24 DIAGNOSIS — M75122 Complete rotator cuff tear or rupture of left shoulder, not specified as traumatic: Secondary | ICD-10-CM | POA: Insufficient documentation

## 2016-08-24 DIAGNOSIS — Z7982 Long term (current) use of aspirin: Secondary | ICD-10-CM | POA: Insufficient documentation

## 2016-08-24 DIAGNOSIS — S43432A Superior glenoid labrum lesion of left shoulder, initial encounter: Secondary | ICD-10-CM | POA: Diagnosis not present

## 2016-08-24 DIAGNOSIS — I10 Essential (primary) hypertension: Secondary | ICD-10-CM | POA: Diagnosis not present

## 2016-08-24 DIAGNOSIS — E119 Type 2 diabetes mellitus without complications: Secondary | ICD-10-CM | POA: Insufficient documentation

## 2016-08-24 DIAGNOSIS — I2510 Atherosclerotic heart disease of native coronary artery without angina pectoris: Secondary | ICD-10-CM | POA: Insufficient documentation

## 2016-08-24 DIAGNOSIS — K219 Gastro-esophageal reflux disease without esophagitis: Secondary | ICD-10-CM | POA: Insufficient documentation

## 2016-08-24 DIAGNOSIS — X58XXXA Exposure to other specified factors, initial encounter: Secondary | ICD-10-CM | POA: Insufficient documentation

## 2016-08-24 DIAGNOSIS — F17210 Nicotine dependence, cigarettes, uncomplicated: Secondary | ICD-10-CM | POA: Diagnosis not present

## 2016-08-24 DIAGNOSIS — M65812 Other synovitis and tenosynovitis, left shoulder: Secondary | ICD-10-CM | POA: Diagnosis not present

## 2016-08-24 DIAGNOSIS — Z800 Family history of malignant neoplasm of digestive organs: Secondary | ICD-10-CM | POA: Diagnosis not present

## 2016-08-24 DIAGNOSIS — Z7984 Long term (current) use of oral hypoglycemic drugs: Secondary | ICD-10-CM | POA: Diagnosis not present

## 2016-08-24 DIAGNOSIS — E785 Hyperlipidemia, unspecified: Secondary | ICD-10-CM | POA: Insufficient documentation

## 2016-08-24 HISTORY — PX: SHOULDER ARTHROSCOPY WITH ROTATOR CUFF REPAIR AND SUBACROMIAL DECOMPRESSION: SHX5686

## 2016-08-24 LAB — GLUCOSE, CAPILLARY
GLUCOSE-CAPILLARY: 173 mg/dL — AB (ref 65–99)
GLUCOSE-CAPILLARY: 188 mg/dL — AB (ref 65–99)

## 2016-08-24 SURGERY — SHOULDER ARTHROSCOPY WITH ROTATOR CUFF REPAIR AND SUBACROMIAL DECOMPRESSION
Anesthesia: General | Site: Shoulder | Laterality: Left

## 2016-08-24 MED ORDER — GLYCOPYRROLATE 0.2 MG/ML IJ SOLN: 0.2000 mg | Freq: Once | INTRAMUSCULAR | Status: DC | PRN

## 2016-08-24 MED ORDER — OXYCODONE-ACETAMINOPHEN 5-325 MG PO TABS: 1.0000 | ORAL_TABLET | ORAL | 0 refills | Status: DC | PRN

## 2016-08-24 MED ORDER — FENTANYL CITRATE (PF) 100 MCG/2ML IJ SOLN
50.0000 ug | INTRAMUSCULAR | Status: DC | PRN
  Administered 2016-08-24: 100 ug via INTRAVENOUS

## 2016-08-24 MED ORDER — LIDOCAINE HCL (CARDIAC) 20 MG/ML IV SOLN
INTRAVENOUS | Status: DC | PRN
  Administered 2016-08-24: 30 mg via INTRAVENOUS

## 2016-08-24 MED ORDER — FENTANYL CITRATE (PF) 100 MCG/2ML IJ SOLN
INTRAMUSCULAR | Status: AC
  Filled 2016-08-24: qty 2

## 2016-08-24 MED ORDER — PHENYLEPHRINE HCL 10 MG/ML IJ SOLN
INTRAMUSCULAR | Status: DC | PRN
Start: 2016-08-24 — End: 2016-08-24
  Administered 2016-08-24: 40 ug via INTRAVENOUS

## 2016-08-24 MED ORDER — PROPOFOL 10 MG/ML IV BOLUS
INTRAVENOUS | Status: DC | PRN
  Administered 2016-08-24: 150 mg via INTRAVENOUS

## 2016-08-24 MED ORDER — DEXAMETHASONE SODIUM PHOSPHATE 4 MG/ML IJ SOLN
INTRAMUSCULAR | Status: DC | PRN
  Administered 2016-08-24: 4 mg via INTRAVENOUS

## 2016-08-24 MED ORDER — CEFAZOLIN SODIUM-DEXTROSE 2-4 GM/100ML-% IV SOLN
INTRAVENOUS | Status: AC
  Filled 2016-08-24: qty 100

## 2016-08-24 MED ORDER — MIDAZOLAM HCL 2 MG/2ML IJ SOLN
INTRAMUSCULAR | Status: AC
  Filled 2016-08-24: qty 2

## 2016-08-24 MED ORDER — EPHEDRINE SULFATE 50 MG/ML IJ SOLN
INTRAMUSCULAR | Status: DC | PRN
  Administered 2016-08-24 (×3): 10 mg via INTRAVENOUS

## 2016-08-24 MED ORDER — LACTATED RINGERS IV SOLN
INTRAVENOUS | Status: DC
  Administered 2016-08-24: 10 mL/h via INTRAVENOUS
  Administered 2016-08-24: 13:00:00 via INTRAVENOUS

## 2016-08-24 MED ORDER — MIDAZOLAM HCL 2 MG/2ML IJ SOLN
1.0000 mg | INTRAMUSCULAR | Status: DC | PRN
  Administered 2016-08-24: 2 mg via INTRAVENOUS

## 2016-08-24 MED ORDER — POVIDONE-IODINE 7.5 % EX SOLN: Freq: Once | CUTANEOUS | Status: DC

## 2016-08-24 MED ORDER — ONDANSETRON HCL 4 MG/2ML IJ SOLN
INTRAMUSCULAR | Status: DC | PRN
  Administered 2016-08-24: 4 mg via INTRAVENOUS

## 2016-08-24 MED ORDER — MEPERIDINE HCL 25 MG/ML IJ SOLN: 6.2500 mg | INTRAMUSCULAR | Status: DC | PRN

## 2016-08-24 MED ORDER — CEFAZOLIN SODIUM-DEXTROSE 2-4 GM/100ML-% IV SOLN
2.0000 g | INTRAVENOUS | Status: AC
  Administered 2016-08-24: 2 g via INTRAVENOUS

## 2016-08-24 MED ORDER — SODIUM CHLORIDE 0.9 % IR SOLN
Status: DC | PRN
  Administered 2016-08-24: 6000 mL

## 2016-08-24 MED ORDER — ONDANSETRON HCL 4 MG/2ML IJ SOLN: 4.0000 mg | Freq: Once | INTRAMUSCULAR | Status: DC | PRN

## 2016-08-24 MED ORDER — SUCCINYLCHOLINE CHLORIDE 20 MG/ML IJ SOLN
INTRAMUSCULAR | Status: DC | PRN
  Administered 2016-08-24: 100 mg via INTRAVENOUS

## 2016-08-24 MED ORDER — HYDROMORPHONE HCL 1 MG/ML IJ SOLN: 0.2500 mg | INTRAMUSCULAR | Status: DC | PRN

## 2016-08-24 MED ORDER — SCOPOLAMINE 1 MG/3DAYS TD PT72: 1.0000 | MEDICATED_PATCH | Freq: Once | TRANSDERMAL | Status: DC | PRN

## 2016-08-24 SURGICAL SUPPLY — 85 items
ANCHOR ALLTHRD KNTLS PEEK 5.5 (Anchor) ×3 IMPLANT
BENZOIN TINCTURE PRP APPL 2/3 (GAUZE/BANDAGES/DRESSINGS) IMPLANT
BLADE CLIPPER SURG (BLADE) IMPLANT
BLADE SURG 15 STRL LF DISP TIS (BLADE) IMPLANT
BLADE SURG 15 STRL SS (BLADE)
BUR OVAL 4.0 (BURR) ×3 IMPLANT
CANNULA 5.75X71 LONG (CANNULA) ×3 IMPLANT
CANNULA TWIST IN 8.25X7CM (CANNULA) ×3 IMPLANT
CHLORAPREP W/TINT 26ML (MISCELLANEOUS) ×3 IMPLANT
DECANTER SPIKE VIAL GLASS SM (MISCELLANEOUS) IMPLANT
DERMABOND ADVANCED (GAUZE/BANDAGES/DRESSINGS)
DERMABOND ADVANCED .7 DNX12 (GAUZE/BANDAGES/DRESSINGS) IMPLANT
DRAPE IMP U-DRAPE 54X76 (DRAPES) ×3 IMPLANT
DRAPE INCISE IOBAN 66X45 STRL (DRAPES) ×3 IMPLANT
DRAPE STERI 35X30 U-POUCH (DRAPES) ×3 IMPLANT
DRAPE SURG 17X23 STRL (DRAPES) ×3 IMPLANT
DRAPE U-SHAPE 47X51 STRL (DRAPES) ×3 IMPLANT
DRAPE U-SHAPE 76X120 STRL (DRAPES) ×6 IMPLANT
DRSG PAD ABDOMINAL 8X10 ST (GAUZE/BANDAGES/DRESSINGS) ×3 IMPLANT
ELECT REM PT RETURN 9FT ADLT (ELECTROSURGICAL) ×3
ELECTRODE REM PT RTRN 9FT ADLT (ELECTROSURGICAL) ×2 IMPLANT
GAUZE SPONGE 4X4 12PLY STRL (GAUZE/BANDAGES/DRESSINGS) ×3 IMPLANT
GAUZE SPONGE 4X4 16PLY XRAY LF (GAUZE/BANDAGES/DRESSINGS) IMPLANT
GAUZE XEROFORM 1X8 LF (GAUZE/BANDAGES/DRESSINGS) ×3 IMPLANT
GLOVE BIO SURGEON STRL SZ7 (GLOVE) ×6 IMPLANT
GLOVE BIO SURGEON STRL SZ7.5 (GLOVE) ×6 IMPLANT
GLOVE BIOGEL PI IND STRL 7.0 (GLOVE) ×4 IMPLANT
GLOVE BIOGEL PI IND STRL 7.5 (GLOVE) ×2 IMPLANT
GLOVE BIOGEL PI IND STRL 8 (GLOVE) ×4 IMPLANT
GLOVE BIOGEL PI INDICATOR 7.0 (GLOVE) ×2
GLOVE BIOGEL PI INDICATOR 7.5 (GLOVE) ×1
GLOVE BIOGEL PI INDICATOR 8 (GLOVE) ×2
GLOVE ECLIPSE 6.5 STRL STRAW (GLOVE) ×3 IMPLANT
GOWN STRL REUS W/ TWL LRG LVL3 (GOWN DISPOSABLE) ×4 IMPLANT
GOWN STRL REUS W/ TWL XL LVL3 (GOWN DISPOSABLE) ×6 IMPLANT
GOWN STRL REUS W/TWL LRG LVL3 (GOWN DISPOSABLE) ×2
GOWN STRL REUS W/TWL XL LVL3 (GOWN DISPOSABLE) ×3
IMPL ANCHOR PEEK 4.5MM (Anchor) ×2 IMPLANT
IMPLANT ANCHOR PEEK 4.5MM (Anchor) ×3 IMPLANT
IV NS IRRIG 3000ML ARTHROMATIC (IV SOLUTION) ×9 IMPLANT
LASSO CRESCENT QUICKPASS (SUTURE) IMPLANT
MANIFOLD NEPTUNE II (INSTRUMENTS) ×3 IMPLANT
NDL SUT 6 .5 CRC .975X.05 MAYO (NEEDLE) IMPLANT
NEEDLE 1/2 CIR CATGUT .05X1.09 (NEEDLE) IMPLANT
NEEDLE MAYO TAPER (NEEDLE)
NEEDLE SCORPION MULTI FIRE (NEEDLE) ×3 IMPLANT
NS IRRIG 1000ML POUR BTL (IV SOLUTION) IMPLANT
PACK ARTHROSCOPY DSU (CUSTOM PROCEDURE TRAY) ×3 IMPLANT
PACK BASIN DAY SURGERY FS (CUSTOM PROCEDURE TRAY) ×3 IMPLANT
PENCIL BUTTON HOLSTER BLD 10FT (ELECTRODE) IMPLANT
PROBE BIPOLAR ATHRO 135MM 90D (MISCELLANEOUS) ×3 IMPLANT
RESECTOR FULL RADIUS 4.2MM (BLADE) ×3 IMPLANT
SHEET MEDIUM DRAPE 40X70 STRL (DRAPES) IMPLANT
SLEEVE SCD COMPRESS KNEE MED (MISCELLANEOUS) ×3 IMPLANT
SLING ARM FOAM STRAP LRG (SOFTGOODS) IMPLANT
SLING ARM IMMOBILIZER LRG (SOFTGOODS) ×3 IMPLANT
SLING ARM IMMOBILIZER MED (SOFTGOODS) IMPLANT
SLING ARM MED ADULT FOAM STRAP (SOFTGOODS) IMPLANT
SLING ARM XL FOAM STRAP (SOFTGOODS) IMPLANT
SPONGE LAP 4X18 X RAY DECT (DISPOSABLE) IMPLANT
STRIP CLOSURE SKIN 1/2X4 (GAUZE/BANDAGES/DRESSINGS) IMPLANT
SUCTION FRAZIER HANDLE 10FR (MISCELLANEOUS)
SUCTION TUBE FRAZIER 10FR DISP (MISCELLANEOUS) IMPLANT
SUPPORT WRAP ARM LG (MISCELLANEOUS) ×3 IMPLANT
SUT BONE WAX W31G (SUTURE) IMPLANT
SUT ETHILON 3 0 PS 1 (SUTURE) ×3 IMPLANT
SUT ETHILON 4 0 PS 2 18 (SUTURE) IMPLANT
SUT FIBERWIRE #2 38 T-5 BLUE (SUTURE)
SUT MNCRL AB 4-0 PS2 18 (SUTURE) IMPLANT
SUT PDS AB 0 CT 36 (SUTURE) ×3 IMPLANT
SUT PROLENE 3 0 PS 2 (SUTURE) IMPLANT
SUT TIGER TAPE 7 IN WHITE (SUTURE) IMPLANT
SUT VIC AB 0 CT1 27 (SUTURE)
SUT VIC AB 0 CT1 27XBRD ANBCTR (SUTURE) IMPLANT
SUT VIC AB 2-0 SH 27 (SUTURE)
SUT VIC AB 2-0 SH 27XBRD (SUTURE) IMPLANT
SUTURE FIBERWR #2 38 T-5 BLUE (SUTURE) IMPLANT
SYR BULB 3OZ (MISCELLANEOUS) IMPLANT
TAPE FIBER 2MM 7IN #2 BLUE (SUTURE) ×3 IMPLANT
TOWEL OR 17X24 6PK STRL BLUE (TOWEL DISPOSABLE) ×3 IMPLANT
TOWEL OR NON WOVEN STRL DISP B (DISPOSABLE) ×3 IMPLANT
TUBE CONNECTING 20X1/4 (TUBING) ×3 IMPLANT
TUBING ARTHROSCOPY IRRIG 16FT (MISCELLANEOUS) ×3 IMPLANT
WATER STERILE IRR 1000ML POUR (IV SOLUTION) ×3 IMPLANT
YANKAUER SUCT BULB TIP NO VENT (SUCTIONS) IMPLANT

## 2016-08-24 NOTE — Transfer of Care (Signed)
Immediate Anesthesia Transfer of Care Note  Patient: Tristan Camacho  Procedure(s) Performed: Procedure(s) with comments: SHOULDER ARTHROSCOPY WITH SUBACROMIAL DECOMPRESSION AND DEBRIDEMENT (Left) - SHOULDER ARTHROSCOPY WITH SUBACROMIAL DECOMPRESSION AND DEBRIDEMENT  Patient Location: PACU  Anesthesia Type:GA combined with regional for post-op pain  Level of Consciousness: awake, alert , oriented and patient cooperative  Airway & Oxygen Therapy: Patient Spontanous Breathing and Patient connected to face mask oxygen  Post-op Assessment: Report given to RN and Post -op Vital signs reviewed and stable  Post vital signs: Reviewed and stable  Last Vitals:  Vitals:   08/24/16 1125 08/24/16 1130  BP:    Pulse: 75 81  Resp: (!) 9 12  Temp:      Last Pain:  Vitals:   08/24/16 1005  TempSrc: Oral         Complications: No apparent anesthesia complications

## 2016-08-24 NOTE — Discharge Instructions (Signed)
Discharge Instructions after Arthroscopic Shoulder Repair ° ° °A sling has been provided for you. Remain in your sling at all times. This includes sleeping in your sling.  °Use ice on the shoulder intermittently over the first 48 hours after surgery.  °Pain medicine has been prescribed for you.  °Use your medicine liberally over the first 48 hours, and then you can begin to taper your use. You may take Extra Strength Tylenol or Tylenol only in place of the pain pills. DO NOT take ANY nonsteroidal anti-inflammatory pain medications: Advil, Motrin, Ibuprofen, Aleve, Naproxen, or Narprosyn.  °You may remove your dressing after two days. If the incision sites are still moist, place a Band-Aid over the moist site(s). Change Band-Aids daily until dry.  °You may shower 5 days after surgery. The incisions CANNOT get wet prior to 5 days. Simply allow the water to wash over the site and then pat dry. Do not rub the incisions. Make sure your axilla (armpit) is completely dry after showering.  °Take one aspirin a day for 2 weeks after surgery, unless you have an aspirin sensitivity/ allergy or asthma. ° ° °Please call [phone] during normal business hours or [phone] after hours for any problems. Including the following: ° °- excessive redness of the incisions °- drainage for more than 4 days °- fever of more than 101.5 F ° °*Please note that pain medications will not be refilled after hours or on weekends. ° °Regional Anesthesia Blocks ° °1. Numbness or the inability to move the "blocked" extremity may last from 3-48 hours after placement. The length of time depends on the medication injected and your individual response to the medication. If the numbness is not going away after 48 hours, call your surgeon. ° °2. The extremity that is blocked will need to be protected until the numbness is gone and the  Strength has returned. Because you cannot feel it, you will need to take extra care to avoid injury. Because it may  be weak, you may have difficulty moving it or using it. You may not know what position it is in without looking at it while the block is in effect. ° °3. For blocks in the legs and feet, returning to weight bearing and walking needs to be done carefully. You will need to wait until the numbness is entirely gone and the strength has returned. You should be able to move your leg and foot normally before you try and bear weight or walk. You will need someone to be with you when you first try to ensure you do not fall and possibly risk injury. ° °4. Bruising and tenderness at the needle site are common side effects and will resolve in a few days. ° °5. Persistent numbness or new problems with movement should be communicated to the surgeon or the [facility] ([phone])/ [facility] ([phone]). ° °Post Anesthesia Home Care Instructions ° °Activity: °Get plenty of rest for the remainder of the day. A responsible adult should stay with you for 24 hours following the procedure.  °For the next 24 hours, DO NOT: °-Drive a car °-Operate machinery °-Drink alcoholic beverages °-Take any medication unless instructed by your physician °-Make any legal decisions or sign important papers. ° °Meals: °Start with liquid foods such as gelatin or soup. Progress to regular foods as tolerated. Avoid greasy, spicy, heavy foods. If nausea and/or vomiting occur, drink only clear liquids until the nausea and/or vomiting subsides. Call your physician if vomiting continues. ° °  Special Instructions/Symptoms: °Your throat may feel dry or sore from the anesthesia or the breathing tube placed in your throat during surgery. If this causes discomfort, gargle with warm salt water. The discomfort should disappear within 24 hours. ° °If you had a scopolamine patch placed behind your ear for the management of post- operative nausea and/or vomiting: ° °1. The medication in the patch is effective for 72 hours, after which it  should be removed.  Wrap patch in a tissue and discard in the trash. Wash hands thoroughly with soap and water. °2. You may remove the patch earlier than 72 hours if you experience unpleasant side effects which may include dry mouth, dizziness or visual disturbances. °3. Avoid touching the patch. Wash your hands with soap and water after contact with the patch. °  ° ° °

## 2016-08-24 NOTE — Anesthesia Postprocedure Evaluation (Signed)
Anesthesia Post Note  Patient: Tristan Camacho  Procedure(s) Performed: Procedure(s) (LRB): SHOULDER ARTHROSCOPY WITH SUBACROMIAL DECOMPRESSION AND DEBRIDEMENT, Distal Clavicle Excision (Left)  Patient location during evaluation: PACU Anesthesia Type: General Level of consciousness: awake and alert Pain management: pain level controlled Vital Signs Assessment: post-procedure vital signs reviewed and stable Respiratory status: spontaneous breathing, nonlabored ventilation, respiratory function stable and patient connected to nasal cannula oxygen Cardiovascular status: blood pressure returned to baseline and stable Postop Assessment: no signs of nausea or vomiting Anesthetic complications: no    Last Vitals:  Vitals:   08/24/16 1345 08/24/16 1400  BP: (!) 144/97 (!) 136/95  Pulse: 99 99  Resp: 16 15  Temp: 36.7 C     Last Pain:  Vitals:   08/24/16 1415  TempSrc:   PainSc: 0-No pain                 Tanai Bouler DAVID

## 2016-08-24 NOTE — Progress Notes (Signed)
AssistedDr. Ossey with left, ultrasound guided, interscalene  block. Side rails up, monitors on throughout procedure. See vital signs in flow sheet. Tolerated Procedure well.  

## 2016-08-24 NOTE — Op Note (Signed)
Procedure(s): SHOULDER ARTHROSCOPY WITH SUBACROMIAL DECOMPRESSION AND DEBRIDEMENT Procedure Note  Tristan Camacho male 50 y.o. 08/24/2016  Procedure(s) and Anesthesia Type:   #1 left shoulder arthroscopic rotator cuff repair    #2 left shoulder arthroscopic subacromial decompression   Surgeon(s) and Role:    * Jones Broom, MD - Primary     Surgeon: Mable Paris   Assistants: Damita Lack PA-C (Danielle was present and scrubbed throughout the procedure and was essential in positioning, assisting with the camera and instrumentation,, and closure)  Anesthesia: General endotracheal anesthesia with preoperative interscalene block given by the attending anesthesiologist    Procedure Detail  SHOULDER ARTHROSCOPY WITH SUBACROMIAL DECOMPRESSION AND DEBRIDEMENT  Estimated Blood Loss: Min         Drains: none  Blood Given: none         Specimens: none        Complications:  * No complications entered in OR log *         Disposition: PACU - hemodynamically stable.         Condition: stable    Procedure:   INDICATIONS FOR SURGERY: The patient is 50 y.o. male who has had a long history of left shoulder pain which is failed extensive conservative management. He was found to have unfavorable acromial anatomy and rotator cuff tendinopathy.  OPERATIVE FINDINGS: Examination under anesthesia: He has mild stiffness under anesthesia with about forward flexion 150, abducted external rotation to 60. With gentle manipulation I was able to break through some mild adhesions with satisfactory lysis and full range of motion.   DESCRIPTION OF PROCEDURE: The patient was identified in preoperative  holding area where I personally marked the operative site after  verifying site, side, and procedure with the patient. An interscalene block was given by the attending anesthesiologist the holding area.  The patient was taken back to the operating room where general anesthesia was  induced without complication and was placed in the beach-chair position with the back  elevated about 60 degrees and all extremities and head and neck carefully padded and  positioned.   The left upper extremity was then prepped and  draped in a standard sterile fashion. The appropriate time-out  procedure was carried out. The patient did receive IV antibiotics  within 30 minutes of incision.   A small posterior portal incision was made and the arthroscope was introduced into the joint. An anterior portal was then established above the subscapularis using needle localization. Small cannula was placed anteriorly. Diagnostic arthroscopy was then carried out.  He is noted to have moderate synovitis in the joint. There is a type I SLAP tear which was debrided extensively the shaver back to healthy labrum. The biceps tendon was intact. Glenohumeral joint surfaces were intact. Subscapularis was intact. The ArthroCare was used to ablate the synovitis anteriorly. The undersurface of the supraspinatus was carefully examined. He was noted to have extensive tearing. After debridement it was clear that this did extend to a full-thickness tear centrally.  The arthroscope was then introduced into the subacromial space a standard lateral portal was established with needle localization. The shaver was used through the lateral portal to perform extensive bursectomy. Coracoacromial ligament was examined and found to be frayed indicating chronic impingement.  The bursal side of the rotator cuff tear was identified easily. The shaver was used to debride back to healthy tendon. The tuberosity was also debrided down to bleeding bone to promote healing. A lateral cannula was placed for repair. A posterior lateral  viewing portal was established. The repair was then carried out by placing one 5.5 mm screw in anchor medially preloaded with 2 fiber tapes. These 4 suture strands were then passed medially throughout the edge of  the tendon and brought over to a lateral row 4.5 mm anchor, bringing the tendon nicely down over the prepared tuberosity and a smooth even fashion. The repair was complete and watertight. There was no significant tension on the repair.  The coracoacromial ligament was taken down off the anterior acromion with the ArthroCare exposing a large hooked anterior acromial spur. A high-speed bur was then used through the lateral portal to take down the anterior acromial spur from lateral to medial in a standard acromioplasty.  The acromioplasty was also viewed from the lateral portal and the bur was used as necessary to ensure that the acromion was completely flat from posterior to anterior.  The arthroscopic equipment was removed from the joint and the portals were closed with 3-0 nylon in an interrupted fashion. Sterile dressings were then applied including Xeroform 4 x 4's ABDs and tape. The patient was then allowed to awaken from general anesthesia, placed in a sling, transferred to the stretcher and taken to the recovery room in stable condition.   POSTOPERATIVE PLAN: The patient will be discharged home today and will followup in one week for suture removal and wound check.  We will follow the standard cuff protocol.

## 2016-08-24 NOTE — Anesthesia Procedure Notes (Signed)
Procedure Name: Intubation Date/Time: 08/24/2016 12:09 PM Performed by: Sakira Dahmer D Pre-anesthesia Checklist: Patient identified, Emergency Drugs available, Suction available and Patient being monitored Patient Re-evaluated:Patient Re-evaluated prior to inductionOxygen Delivery Method: Circle system utilized Preoxygenation: Pre-oxygenation with 100% oxygen Intubation Type: IV induction Ventilation: Mask ventilation without difficulty Laryngoscope Size: Mac and 4 Grade View: Grade I Tube type: Oral Tube size: 7.0 mm Number of attempts: 1 Airway Equipment and Method: Stylet and Oral airway Placement Confirmation: ETT inserted through vocal cords under direct vision,  positive ETCO2 and breath sounds checked- equal and bilateral Secured at: 20 cm Tube secured with: Tape Dental Injury: Teeth and Oropharynx as per pre-operative assessment

## 2016-08-24 NOTE — Anesthesia Preprocedure Evaluation (Signed)
Anesthesia Evaluation  Patient identified by MRN, date of birth, ID band Patient awake    Reviewed: Allergy & Precautions, NPO status , Patient's Chart, lab work & pertinent test results  Airway Mallampati: I  TM Distance: >3 FB Neck ROM: Full    Dental   Pulmonary Current Smoker,    Pulmonary exam normal        Cardiovascular hypertension, Normal cardiovascular exam     Neuro/Psych    GI/Hepatic GERD  Medicated and Controlled,  Endo/Other  diabetes  Renal/GU      Musculoskeletal   Abdominal   Peds  Hematology   Anesthesia Other Findings   Reproductive/Obstetrics                             Anesthesia Physical Anesthesia Plan  ASA: III  Anesthesia Plan: General   Post-op Pain Management:  Regional for Post-op pain   Induction: Intravenous  Airway Management Planned: Oral ETT  Additional Equipment:   Intra-op Plan:   Post-operative Plan: Extubation in OR  Informed Consent: I have reviewed the patients History and Physical, chart, labs and discussed the procedure including the risks, benefits and alternatives for the proposed anesthesia with the patient or authorized representative who has indicated his/her understanding and acceptance.     Plan Discussed with: CRNA and Surgeon  Anesthesia Plan Comments:         Anesthesia Quick Evaluation

## 2016-08-24 NOTE — Anesthesia Procedure Notes (Addendum)
Anesthesia Regional Block:  Interscalene brachial plexus block  Pre-Anesthetic Checklist: ,, timeout performed, Correct Patient, Correct Site, Correct Laterality, Correct Procedure, Correct Position, site marked, Risks and benefits discussed,  Surgical consent,  Pre-op evaluation,  At surgeon's request and post-op pain management  Laterality: Left  Prep: chloraprep       Needles:  Injection technique: Single-shot  Needle Type: Other     Needle Length: 9cm 9 cm Needle Gauge: 22 and 22 G  Needle insertion depth: 5 cm   Additional Needles:  Procedures: ultrasound guided (picture in chart) and nerve stimulator Interscalene brachial plexus block Narrative:  Start time: 08/24/2016 11:15 AM End time: 08/24/2016 11:25 AM Injection made incrementally with aspirations every 5 mL.  Performed by: Personally  Anesthesiologist: Arta BruceSSEY, Jacquita Mulhearn  Additional Notes: Monitors applied. Patient sedated. Sterile prep and drape,hand hygiene and sterile gloves were used. Relevant anatomy identified.Needle position confirmed.Local anesthetic injected incrementally after negative aspiration. Local anesthetic spread visualized around nerve(s). Vascular puncture avoided. No complications. Image printed for medical record.The patient tolerated the procedure well.

## 2016-08-24 NOTE — H&P (Signed)
Tristan Camacho is an 50 y.o. male.   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  Past Medical History:  Diagnosis Date  . CAD (coronary artery disease)    a. 12/2013 Cath/PCI: LM nl, LAD 90p (3.0x16 Promus Premier DES), LCX nl, RCA 100, r->r, l->r collats, EF 60% w/o rwma.  . Diabetes mellitus without complication (HCC)   . GERD (gastroesophageal reflux disease)   . Hyperlipidemia   . Hypertension   . Tobacco abuse     Past Surgical History:  Procedure Laterality Date  . CORONARY STENT PLACEMENT    . LEFT HEART CATHETERIZATION WITH CORONARY ANGIOGRAM Bilateral 12/20/2013   Procedure: LEFT HEART CATHETERIZATION WITH CORONARY ANGIOGRAM;  Surgeon: Lesleigh NoeHenry W Smith III, MD;  [facility];  Service: Cardiovascular;  Laterality: Bilateral;  . SHOULDER SURGERY Right     Family History  Problem Relation Age of Onset  . Liver cancer Father   . Lung cancer Paternal Uncle    Social History:  reports that he has been smoking.  He has been smoking about 0.25 packs per day. He has never used smokeless tobacco. He reports that he does not drink alcohol or use drugs.  Allergies: No Known Allergies  Medications Prior to Admission  Medication Sig Dispense Refill  . aspirin EC 81 MG tablet Take 1 tablet (81 mg total) by mouth daily.    . canagliflozin (INVOKANA) 300 MG TABS tablet Take 1 tablet (300 mg total) by mouth daily. PATIENT NEEDS OFFICE VISIT FOR ADDITIONAL REFILLS 30 tablet 0  . cholecalciferol (VITAMIN D) 1000 UNITS tablet Take 1,000 Units by mouth daily. Reported on 01/20/2016    . losartan-hydrochlorothiazide (HYZAAR) 100-12.5 MG tablet Take 1 tablet by mouth daily. 90 tablet 3  . metFORMIN (GLUCOPHAGE) 1000 MG tablet Take 1 tablet (1,000 mg total) by mouth 2 (two) times daily with a meal. 180 tablet 3  . nitroGLYCERIN (NITROSTAT) 0.4 MG SL tablet Place 1 tablet (0.4 mg total)  under the tongue every 5 (five) minutes x 3 doses as needed for chest pain. 25 tablet 3  . omeprazole (PRILOSEC) 20 MG capsule Take 1 capsule (20 mg total) by mouth 2 (two) times daily before a meal. Reported on 01/20/2016 30 capsule 11  . tadalafil (CIALIS) 5 MG tablet Take 5 mg by mouth daily as needed for erectile dysfunction (erectile dysfunction). Reported on 01/20/2016    . tamsulosin (FLOMAX) 0.4 MG CAPS capsule Take 1 capsule (0.4 mg total) by mouth daily after supper. Reported on 01/20/2016 30 capsule 11  . vitamin C (ASCORBIC ACID) 500 MG tablet Take 500 mg by mouth daily.      Results for orders placed or performed during the hospital encounter of 08/24/16 (from the past 48 hour(s))  Glucose, capillary     Status: Abnormal   Collection Time: 08/24/16 10:23 AM  Result Value Ref Range   Glucose-Capillary 173 (H) 65 - 99 mg/dL   No results found.  ROS  Blood pressure 121/85, pulse 81, temperature 98.5 F (36.9 C), resp. rate 12, height 6' (1.829 m), weight 103 kg (227 lb), SpO2 99 %. 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 Plan L arthr debride RC/ SAD Risks / benefits of surgery discussed Consent on chart  NPO for OR Preop antibiotics   Mable ParisHANDLER,Tristan Outman WILLIAM, MD 08/24/2016, 11:34 AM

## 2016-08-25 ENCOUNTER — Encounter (HOSPITAL_BASED_OUTPATIENT_CLINIC_OR_DEPARTMENT_OTHER): Payer: Self-pay | Admitting: Orthopedic Surgery

## 2016-09-08 ENCOUNTER — Encounter (HOSPITAL_BASED_OUTPATIENT_CLINIC_OR_DEPARTMENT_OTHER): Payer: Self-pay | Admitting: Orthopedic Surgery

## 2016-11-29 ENCOUNTER — Other Ambulatory Visit: Payer: Self-pay | Admitting: Emergency Medicine

## 2016-12-23 ENCOUNTER — Other Ambulatory Visit: Payer: Self-pay | Admitting: Emergency Medicine

## 2017-01-28 ENCOUNTER — Encounter (INDEPENDENT_AMBULATORY_CARE_PROVIDER_SITE_OTHER): Payer: Self-pay | Admitting: Orthopedic Surgery

## 2017-01-28 ENCOUNTER — Ambulatory Visit (INDEPENDENT_AMBULATORY_CARE_PROVIDER_SITE_OTHER): Payer: Self-pay

## 2017-01-28 ENCOUNTER — Ambulatory Visit (INDEPENDENT_AMBULATORY_CARE_PROVIDER_SITE_OTHER): Payer: BLUE CROSS/BLUE SHIELD | Admitting: Orthopedic Surgery

## 2017-01-28 VITALS — Ht 72.0 in | Wt 227.0 lb

## 2017-01-28 DIAGNOSIS — M25571 Pain in right ankle and joints of right foot: Secondary | ICD-10-CM | POA: Diagnosis not present

## 2017-01-28 LAB — HEPATIC FUNCTION PANEL
ALBUMIN: 4 g/dL (ref 3.6–5.1)
ALT: 13 U/L (ref 9–46)
AST: 11 U/L (ref 10–35)
Alkaline Phosphatase: 71 U/L (ref 40–115)
BILIRUBIN DIRECT: 0.1 mg/dL (ref <=0.2)
BILIRUBIN TOTAL: 0.7 mg/dL (ref 0.2–1.2)
Indirect Bilirubin: 0.6 mg/dL (ref 0.2–1.2)
Total Protein: 6.7 g/dL (ref 6.1–8.1)

## 2017-01-28 NOTE — Progress Notes (Signed)
Office Visit Note   Patient: Tristan Camacho           Date of Birth: 1966/04/21           MRN: [id number] Visit Date: 01/28/2017              Requested by: Fleet ContrasEdwin Avbuere, MD [address] [place], [place] [id number] PCP: Dorrene GermanEdwin A Avbuere, MD  Chief Complaint  Patient presents with  . Right Ankle - Pain    HPI: The patient is a 51 year old gentleman with a four-day history of right ankle pain. His wife reports that he developed quite a bit of karate when he was younger and ankle pain caused him to stop doing karate. No known recent injury though. Is unable to weight-bear due to pain. points to the medial and lateral ankles as well as of the calf is most tender areas. Denies swelling.   Assessment & Plan: Visit Diagnoses:  1. Pain in right ankle and joints of right foot     Plan: We will draw uric acid today to rule out gout. We'll place him in a fracture boot to rest the peroneal and posterior tibial tendons. Recommended a 2 week course of anti-inflammatories. We'll follow-up in 2 weeks to reevaluate.  Follow-Up Instructions: Return in about 2 weeks (around 02/11/2017).   Physical Exam  Constitutional: Appears well-developed.  Head: Normocephalic.  Eyes: EOM are normal.  Neck: Normal range of motion.  Cardiovascular: Normal rate.   Pulmonary/Chest: Effort normal.  Neurological: Is alert.  Skin: Skin is warm.  Psychiatric: Has a normal mood and affect. Right Ankle Exam  Swelling: mild  Tenderness  Right ankle tenderness location: PTT and peroneal tendons tender.    Comments:  Pain with dorsiflexion and plantar flexion eversion and inversion. Does not have a positive too many toes sign. Is unable to do a single limb heel raise.     tenderness over insertion of peroneal tendon on right as well as over course peroneal tendon. Proximal PTT tender.    Imaging: Xr Ankle Complete Right  Result Date: 01/28/2017 Radiographs of right ankle are negative for fracture. No bony  abnormality.    Labs: Lab Results  Component Value Date   HGBA1C 8.0 01/20/2016   HGBA1C 7.5 07/31/2014   HGBA1C 7.9 05/30/2014    Orders:  Orders Placed This Encounter  Procedures  . XR Ankle Complete Right  . Uric acid  . Hepatic function panel   No orders of the defined types were placed in this encounter.    Procedures: No procedures performed  Clinical Data: No additional findings.  ROS: Review of Systems  Constitutional: Negative for chills and fever.  Musculoskeletal: Positive for myalgias. Negative for arthralgias and joint swelling.    Objective: Vital Signs: Ht 6' (1.829 m)   Wt 227 lb (103 kg)   BMI 30.79 kg/m   Specialty Comments:  No specialty comments available.  PMFS History: Patient Active Problem List   Diagnosis Date Noted  . Chronic diastolic heart failure (HCC) 01/25/2014  . CAD (coronary artery disease) 12/21/2013  . Tobacco abuse 12/21/2013  . Hyperlipidemia   . Diabetes mellitus type II, uncontrolled (HCC) 12/19/2013  . HTN (hypertension) 12/19/2013  . Unstable angina pectoris (HCC) 12/19/2013   Past Medical History:  Diagnosis Date  . CAD (coronary artery disease)    a. 12/2013 Cath/PCI: LM nl, LAD 90p (3.0x16 Promus Premier DES), LCX nl, RCA 100, r->r, l->r collats, EF 60% w/o rwma.  . Diabetes mellitus  without complication (HCC)   . GERD (gastroesophageal reflux disease)   . Hyperlipidemia   . Hypertension   . Tobacco abuse     Family History  Problem Relation Age of Onset  . Liver cancer Father   . Lung cancer Paternal Uncle     Past Surgical History:  Procedure Laterality Date  . CORONARY STENT PLACEMENT    . LEFT HEART CATHETERIZATION WITH CORONARY ANGIOGRAM Bilateral 12/20/2013   Procedure: LEFT HEART CATHETERIZATION WITH CORONARY ANGIOGRAM;  Surgeon: Lesleigh Noe, MD;  Location: [facility] CATH LAB;  Service: Cardiovascular;  Laterality: Bilateral;  . SHOULDER ARTHROSCOPY WITH ROTATOR CUFF REPAIR AND SUBACROMIAL  DECOMPRESSION Left 08/24/2016   Procedure: SHOULDER ARTHROSCOPY WITH SUBACROMIAL DECOMPRESSION AND DEBRIDEMENT, Distal Clavicle Excision, Arthroscopic rotator cuff repair;  Surgeon: Jones Broom, MD;  Location: [facility];  Service: Orthopedics;  Laterality: Left;  SHOULDER ARTHROSCOPY WITH SUBACROMIAL DECOMPRESSION AND DEBRIDEMENT  . SHOULDER SURGERY Right    Social History   Occupational History  . Not on file.   Social History Main Topics  . Smoking status: Current Every Day Smoker    Packs/day: 0.25  . Smokeless tobacco: Never Used  . Alcohol use No  . Drug use: No  . Sexual activity: Yes

## 2017-01-29 ENCOUNTER — Telehealth (INDEPENDENT_AMBULATORY_CARE_PROVIDER_SITE_OTHER): Payer: Self-pay | Admitting: Radiology

## 2017-01-29 LAB — URIC ACID: URIC ACID, SERUM: 4.4 mg/dL (ref 4.0–8.0)

## 2017-01-29 NOTE — Telephone Encounter (Signed)
I called and left voicemail for patients wife about lab work returning normal. Patient to continue with fracture boot over next two weeks. He may wean out of fracture boot if he is feeling better. Follow up after [place] from [place].

## 2017-01-29 NOTE — Progress Notes (Signed)
Will you call and let them know he doesn't have gout.

## 2017-04-02 ENCOUNTER — Encounter: Payer: Self-pay | Admitting: Interventional Cardiology

## 2017-04-02 ENCOUNTER — Ambulatory Visit (INDEPENDENT_AMBULATORY_CARE_PROVIDER_SITE_OTHER): Payer: BLUE CROSS/BLUE SHIELD | Admitting: Interventional Cardiology

## 2017-04-02 VITALS — BP 118/78 | HR 81 | Ht 72.0 in | Wt 231.0 lb

## 2017-04-02 DIAGNOSIS — E785 Hyperlipidemia, unspecified: Secondary | ICD-10-CM

## 2017-04-02 DIAGNOSIS — I5032 Chronic diastolic (congestive) heart failure: Secondary | ICD-10-CM

## 2017-04-02 DIAGNOSIS — I25118 Atherosclerotic heart disease of native coronary artery with other forms of angina pectoris: Secondary | ICD-10-CM

## 2017-04-02 DIAGNOSIS — I10 Essential (primary) hypertension: Secondary | ICD-10-CM

## 2017-04-02 DIAGNOSIS — E1165 Type 2 diabetes mellitus with hyperglycemia: Secondary | ICD-10-CM | POA: Diagnosis not present

## 2017-04-02 DIAGNOSIS — E1159 Type 2 diabetes mellitus with other circulatory complications: Secondary | ICD-10-CM | POA: Diagnosis not present

## 2017-04-02 DIAGNOSIS — Z720 Tobacco use: Secondary | ICD-10-CM | POA: Diagnosis not present

## 2017-04-02 DIAGNOSIS — IMO0002 Reserved for concepts with insufficient information to code with codable children: Secondary | ICD-10-CM

## 2017-04-02 NOTE — Patient Instructions (Signed)
Medication Instructions:  None  Labwork: Your physician recommends that you return for lab work on the same day as your Myoview.  Nothing to eat or drink after midnight for these labs. (Lipid, liver)  Testing/Procedures: Your physician has requested that you have en exercise stress myoview. For further information please visit [email or web address]. Please follow instruction sheet, as given.   Follow-Up: Your physician wants you to follow-up in: 1 year with Dr. Katrinka BlazingSmith.  You will receive a reminder letter in the mail two months in advance. If you don't receive a letter, please call our office to schedule the follow-up appointment.   Any Other Special Instructions Will Be Listed Below (If Applicable).     If you need a refill on your cardiac medications before your next appointment, please call your pharmacy.

## 2017-04-02 NOTE — Progress Notes (Signed)
Cardiology Office Note    Date:  04/02/2017   ID:  Tristan Camacho, Tristan Camacho 1966/05/02, MRN [id number]  PCP:  Fleet Contras, MD  Cardiologist: Lesleigh Noe, MD   Chief Complaint  Patient presents with  . Coronary Artery Disease    History of Present Illness:  Tristan Camacho is a 51 y.o. male with coronary artery disease including chronic occlusion of RCA, drug-eluting stent LAD 2015, type 2 diabetes, hyperlipidemia, essential hypertension, and tobacco use.  Six-month history of day Exertion related chest tightness that resolves after 2-3 minutes with rest. Wife states that he is very inactive. No discomfort at rest. Denies orthopnea and PND. No peripheral edema. Probable medication compliance.  Past Medical History:  Diagnosis Date  . CAD (coronary artery disease)    a. 12/2013 Cath/PCI: LM nl, LAD 90p (3.0x16 Promus Premier DES), LCX nl, RCA 100, r->r, l->r collats, EF 60% w/o rwma.  . Diabetes mellitus without complication (HCC)   . GERD (gastroesophageal reflux disease)   . Hyperlipidemia   . Hypertension   . Tobacco abuse     Past Surgical History:  Procedure Laterality Date  . CORONARY STENT PLACEMENT    . LEFT HEART CATHETERIZATION WITH CORONARY ANGIOGRAM Bilateral 12/20/2013   Procedure: LEFT HEART CATHETERIZATION WITH CORONARY ANGIOGRAM;  Surgeon: Lesleigh Noe, MD;  Location: [facility] CATH LAB;  Service: Cardiovascular;  Laterality: Bilateral;  . SHOULDER ARTHROSCOPY WITH ROTATOR CUFF REPAIR AND SUBACROMIAL DECOMPRESSION Left 08/24/2016   Procedure: SHOULDER ARTHROSCOPY WITH SUBACROMIAL DECOMPRESSION AND DEBRIDEMENT, Distal Clavicle Excision, Arthroscopic rotator cuff repair;  Surgeon: Jones Broom, MD;  Location: [facility];  Service: Orthopedics;  Laterality: Left;  SHOULDER ARTHROSCOPY WITH SUBACROMIAL DECOMPRESSION AND DEBRIDEMENT  . SHOULDER SURGERY Right     Current Medications: Outpatient Medications Prior to Visit  Medication Sig Dispense Refill   . aspirin EC 81 MG tablet Take 1 tablet (81 mg total) by mouth daily.    . canagliflozin (INVOKANA) 300 MG TABS tablet Take 1 tablet (300 mg total) by mouth daily. PATIENT NEEDS OFFICE VISIT FOR ADDITIONAL REFILLS 30 tablet 0  . cholecalciferol (VITAMIN D) 1000 UNITS tablet Take 1,000 Units by mouth daily. Reported on 01/20/2016    . metFORMIN (GLUCOPHAGE) 1000 MG tablet Take 1 tablet (1,000 mg total) by mouth 2 (two) times daily with a meal. 180 tablet 3  . nitroGLYCERIN (NITROSTAT) 0.4 MG SL tablet Place 1 tablet (0.4 mg total) under the tongue every 5 (five) minutes x 3 doses as needed for chest pain. 25 tablet 3  . omeprazole (PRILOSEC) 20 MG capsule take 1 capsule by mouth twice a day 30 capsule 0  . tamsulosin (FLOMAX) 0.4 MG CAPS capsule Take 1 capsule (0.4 mg total) by mouth daily after supper. Reported on 01/20/2016 30 capsule 11  . vitamin C (ASCORBIC ACID) 500 MG tablet Take 500 mg by mouth daily.    Marland Kitchen losartan-hydrochlorothiazide (HYZAAR) 100-12.5 MG tablet take 1 tablet by mouth once daily (Patient not taking: Reported on 04/02/2017) 90 tablet 0  . oxyCODONE-acetaminophen (ROXICET) 5-325 MG tablet Take 1-2 tablets by mouth every 4 (four) hours as needed for severe pain. (Patient not taking: Reported on 04/02/2017) 60 tablet 0  . tadalafil (CIALIS) 5 MG tablet Take 5 mg by mouth daily as needed for erectile dysfunction (erectile dysfunction). Reported on 01/20/2016     No facility-administered medications prior to visit.      Allergies:   Patient has no known allergies.   Social  History   Social History  . Marital status: Married    Spouse name: N/A  . Number of children: N/A  . Years of education: N/A   Social History Main Topics  . Smoking status: Current Every Day Smoker    Packs/day: 0.25  . Smokeless tobacco: Never Used  . Alcohol use No  . Drug use: No  . Sexual activity: Yes   Other Topics Concern  . None   Social History Narrative  . None     Family History:   The patient's family history includes Liver cancer in his father; Lung cancer in his paternal uncle.   ROS:   Please see the history of present illness.    Leg pain, difficulty urinating, shortness of breath with activity.  All other systems reviewed and are negative.   PHYSICAL EXAM:   VS:  BP 118/78 (BP Location: Right Arm)   Pulse 81   Ht 6' (1.829 m)   Wt 231 lb (104.8 kg)   BMI 31.33 kg/m    GEN: Well nourished, well developed, in no acute distress  HEENT: normal  Neck: no JVD, carotid bruits, or masses Cardiac: RRR; no murmurs, rubs, or gallops,no edema  Respiratory:  clear to auscultation bilaterally, normal work of breathing GI: soft, nontender, nondistended, + BS MS: no deformity or atrophy  Skin: warm and dry, no rash Neuro:  Alert and Oriented x 3, Strength and sensation are intact Psych: euthymic mood, full affect  Wt Readings from Last 3 Encounters:  04/02/17 231 lb (104.8 kg)  01/28/17 227 lb (103 kg)  08/24/16 227 lb (103 kg)      Studies/Labs Reviewed:   EKG:  EKG  Last tracing reveals sinus rhythm, left axis deviation, and poor R-wave progression in October 2017  Recent Labs: 08/18/2016: BUN 14; Creatinine, Ser 0.70; Potassium 3.9; Sodium 138 01/28/2017: ALT 13   Lipid Panel    Component Value Date/Time   CHOL 136 01/20/2016 1047   TRIG 192 (H) 01/20/2016 1047   HDL 33 (L) 01/20/2016 1047   CHOLHDL 4.1 01/20/2016 1047   VLDL 38 (H) 01/20/2016 1047   LDLCALC 65 01/20/2016 1047    Additional studies/ records that were reviewed today include:  No new data. No  prior nuclear test. Plain exercise treadmill test in 2016 did not reveal evidence of ischemia.    ASSESSMENT:    1. Coronary artery disease of native artery of native heart with stable angina pectoris (HCC)   2. Chronic diastolic heart failure (HCC)   3. Essential hypertension   4. Hyperlipidemia, unspecified hyperlipidemia type   5. Tobacco abuse   6. Uncontrolled type 2 diabetes  mellitus with other circulatory complication, without long-term current use of insulin (HCC)      PLAN:  In order of problems listed above:  1. Exertional angina is now present, having slow progression over the past 6 months. Discomfort is relieved by rest. He needs a stress Myoview to determine if there is evidence of ischemia in areas other than inferior wall. He will have an inferior wall ischemic zone because the right coronary is chronically occluded. If high risk, he will need coronary angioma. 2. No evidence of volume overload. 3. Target blood pressure 130/90 mmHg or less. 4. LDL target less than 70. 5. Counseled to discontinue cigarette smoking. He has not set a date for cessation. 6. Recent control status is unknown.  Angina needs to be assessed. Myocardial perfusion imaging will be performed. A fire risk  he will need coronary angiography. Details discussed with the patient and his wife. Smoking cessation is mandatory.  Medication Adjustments/Labs and Tests Ordered: Current medicines are reviewed at length with the patient today.  Concerns regarding medicines are outlined above.  Medication changes, Labs and Tests ordered today are listed in the Patient Instructions below. There are no Patient Instructions on file for this visit.   Signed, Lesleigh Noe, MD  04/02/2017 8:54 AM    [facility] [address] [place], [place], [place]  [address] Phone: [phone]; Fax: [phone]

## 2017-04-06 ENCOUNTER — Telehealth (HOSPITAL_COMMUNITY): Payer: Self-pay | Admitting: *Deleted

## 2017-04-06 NOTE — Telephone Encounter (Signed)
Patient given detailed instructions given by Interpreter Sung,D [phone]#[id number] per Myocardial Perfusion Study Information Sheet for the test on 04/06/17 at 0715. Patient notified to arrive 15 minutes early and that it is imperative to arrive on time for appointment to keep from having the test rescheduled.  If you need to cancel or reschedule your appointment, please call the office within 24 hours of your appointment. . Patient verbalized understanding.Lekesha Claw, Adelene IdlerCynthia W

## 2017-04-07 ENCOUNTER — Other Ambulatory Visit: Payer: BLUE CROSS/BLUE SHIELD | Admitting: *Deleted

## 2017-04-07 ENCOUNTER — Ambulatory Visit (HOSPITAL_COMMUNITY): Payer: BLUE CROSS/BLUE SHIELD | Attending: Cardiovascular Disease

## 2017-04-07 DIAGNOSIS — R0609 Other forms of dyspnea: Secondary | ICD-10-CM | POA: Insufficient documentation

## 2017-04-07 DIAGNOSIS — I10 Essential (primary) hypertension: Secondary | ICD-10-CM | POA: Diagnosis not present

## 2017-04-07 DIAGNOSIS — R9439 Abnormal result of other cardiovascular function study: Secondary | ICD-10-CM | POA: Insufficient documentation

## 2017-04-07 DIAGNOSIS — I25118 Atherosclerotic heart disease of native coronary artery with other forms of angina pectoris: Secondary | ICD-10-CM

## 2017-04-07 DIAGNOSIS — E785 Hyperlipidemia, unspecified: Secondary | ICD-10-CM

## 2017-04-07 DIAGNOSIS — E119 Type 2 diabetes mellitus without complications: Secondary | ICD-10-CM | POA: Insufficient documentation

## 2017-04-07 LAB — HEPATIC FUNCTION PANEL
ALK PHOS: 73 IU/L (ref 39–117)
ALT: 13 IU/L (ref 0–44)
AST: 17 IU/L (ref 0–40)
Albumin: 4.2 g/dL (ref 3.5–5.5)
BILIRUBIN TOTAL: 0.5 mg/dL (ref 0.0–1.2)
BILIRUBIN, DIRECT: 0.12 mg/dL (ref 0.00–0.40)
Total Protein: 6.9 g/dL (ref 6.0–8.5)

## 2017-04-07 LAB — LIPID PANEL
CHOLESTEROL TOTAL: 216 mg/dL — AB (ref 100–199)
Chol/HDL Ratio: 6.4 ratio — ABNORMAL HIGH (ref 0.0–5.0)
HDL: 34 mg/dL — ABNORMAL LOW (ref >39)
LDL Calculated: 114 mg/dL — ABNORMAL HIGH (ref 0–99)
Triglycerides: 339 mg/dL — ABNORMAL HIGH (ref 0–149)
VLDL CHOLESTEROL CAL: 68 mg/dL — AB (ref 5–40)

## 2017-04-07 LAB — MYOCARDIAL PERFUSION IMAGING
CHL CUP NUCLEAR SSS: 13
CHL CUP RESTING HR STRESS: 74 {beats}/min
CSEPED: 11 min
CSEPEDS: 31 s
CSEPEW: 12.4 METS
CSEPHR: 88 %
LHR: 0.33
LV sys vol: 31 mL
LVDIAVOL: 64 mL (ref 62–150)
MPHR: 167 {beats}/min
Peak HR: 148 {beats}/min
SDS: 7
SRS: 6
TID: 0.7

## 2017-04-07 MED ORDER — TECHNETIUM TC 99M TETROFOSMIN IV KIT
32.5000 | PACK | Freq: Once | INTRAVENOUS | Status: AC | PRN
  Administered 2017-04-07: 32.5 via INTRAVENOUS
  Filled 2017-04-07: qty 33

## 2017-04-07 MED ORDER — TECHNETIUM TC 99M TETROFOSMIN IV KIT
10.2000 | PACK | Freq: Once | INTRAVENOUS | Status: AC | PRN
  Administered 2017-04-07: 10.2 via INTRAVENOUS
  Filled 2017-04-07: qty 11

## 2017-04-08 ENCOUNTER — Telehealth: Payer: Self-pay | Admitting: Interventional Cardiology

## 2017-04-08 DIAGNOSIS — E785 Hyperlipidemia, unspecified: Secondary | ICD-10-CM

## 2017-04-08 MED ORDER — ATORVASTATIN CALCIUM 20 MG PO TABS: 20.0000 mg | ORAL_TABLET | Freq: Every day | ORAL | 3 refills | Status: DC

## 2017-04-08 MED ORDER — METOPROLOL SUCCINATE ER 25 MG PO TB24: 25.0000 mg | ORAL_TABLET | Freq: Every day | ORAL | 3 refills | Status: AC

## 2017-04-08 NOTE — Telephone Encounter (Signed)
Spoke with wife, ok per pt.  Went over lab results and stress test results.  Pt will come in to see Dr. Katrinka BlazingSmith 6/15 and will have labs 7/12.  Advised wife of possible side effects of medications.  Wife verbalized understanding and was in agreement with plan.

## 2017-04-08 NOTE — Telephone Encounter (Signed)
New message    [name]

## 2017-04-22 NOTE — Progress Notes (Signed)
Cardiology Office Note    Date:  04/22/2017   ID:  Tristan Camacho, DOB 1965/12/02, MRN [id number]  PCP:  Fleet ContrasAvbuere, Edwin, MD  Cardiologist: Lesleigh NoeHenry W Smith III, MD   No chief complaint on file.   History of Present Illness:  Tristan Camacho is a 51 y.o. male with coronary artery disease including chronic occlusion of RCA, drug-eluting stent LAD 2015, type 2 diabetes, hyperlipidemia, essential hypertension, and tobacco use.  He had stress Myoview performed 04/07/17 and revealed a moderate-sized inferior wall ischemic zone. Perfusion elsewhere was normal. The study was classified as intermediate risk. He has chest discomfort perhaps once or twice every 2-3 weeks. It occurs frequently at rest. He has tried to stop smoking but has not been completely successful. The discomfort lasts up to 10 minutes and gradually resolves. There is associated shortness of breath.   Past Medical History:  Diagnosis Date  . CAD (coronary artery disease)    a. 12/2013 Cath/PCI: LM nl, LAD 90p (3.0x16 Promus Premier DES), LCX nl, RCA 100, r->r, l->r collats, EF 60% w/o rwma.  . Diabetes mellitus without complication (HCC)   . GERD (gastroesophageal reflux disease)   . Hyperlipidemia   . Hypertension   . Tobacco abuse     Past Surgical History:  Procedure Laterality Date  . CORONARY STENT PLACEMENT    . LEFT HEART CATHETERIZATION WITH CORONARY ANGIOGRAM Bilateral 12/20/2013   Procedure: LEFT HEART CATHETERIZATION WITH CORONARY ANGIOGRAM;  Surgeon: Lesleigh NoeHenry W Smith III, MD;  [facility];  Service: Cardiovascular;  Laterality: Bilateral;  . SHOULDER ARTHROSCOPY WITH ROTATOR CUFF REPAIR AND SUBACROMIAL DECOMPRESSION Left 08/24/2016   Procedure: SHOULDER ARTHROSCOPY WITH SUBACROMIAL DECOMPRESSION AND DEBRIDEMENT, Distal Clavicle Excision, Arthroscopic rotator cuff repair;  Surgeon: Jones BroomJustin Chandler, MD;  Location: [facility];  Service: Orthopedics;  Laterality: Left;  SHOULDER ARTHROSCOPY WITH  SUBACROMIAL DECOMPRESSION AND DEBRIDEMENT  . SHOULDER SURGERY Right     Current Medications: Outpatient Medications Prior to Visit  Medication Sig Dispense Refill  . aspirin EC 81 MG tablet Take 1 tablet (81 mg total) by mouth daily.    Marland Kitchen. atorvastatin (LIPITOR) 20 MG tablet Take 1 tablet (20 mg total) by mouth daily. 90 tablet 3  . canagliflozin (INVOKANA) 300 MG TABS tablet Take 1 tablet (300 mg total) by mouth daily. PATIENT NEEDS OFFICE VISIT FOR ADDITIONAL REFILLS 30 tablet 0  . cholecalciferol (VITAMIN D) 1000 UNITS tablet Take 1,000 Units by mouth daily. Reported on 01/20/2016    . lisinopril-hydrochlorothiazide (PRINZIDE,ZESTORETIC) 10-12.5 MG tablet Take 1 tablet by mouth daily.    . metFORMIN (GLUCOPHAGE) 1000 MG tablet Take 1 tablet (1,000 mg total) by mouth 2 (two) times daily with a meal. 180 tablet 3  . metoprolol succinate (TOPROL XL) 25 MG 24 hr tablet Take 1 tablet (25 mg total) by mouth daily. 90 tablet 3  . nitroGLYCERIN (NITROSTAT) 0.4 MG SL tablet Place 1 tablet (0.4 mg total) under the tongue every 5 (five) minutes x 3 doses as needed for chest pain. 25 tablet 3  . omeprazole (PRILOSEC) 20 MG capsule take 1 capsule by mouth twice a day 30 capsule 0  . tamsulosin (FLOMAX) 0.4 MG CAPS capsule Take 1 capsule (0.4 mg total) by mouth daily after supper. Reported on 01/20/2016 30 capsule 11  . vitamin C (ASCORBIC ACID) 500 MG tablet Take 500 mg by mouth daily.     No facility-administered medications prior to visit.      Allergies:   Patient has  no known allergies.   Social History   Social History  . Marital status: Married    Spouse name: N/A  . Number of children: N/A  . Years of education: N/A   Social History Main Topics  . Smoking status: Current Every Day Smoker    Packs/day: 0.25  . Smokeless tobacco: Never Used  . Alcohol use No  . Drug use: No  . Sexual activity: Yes   Other Topics Concern  . Not on file   Social History Narrative  . No narrative on  file     Family History:  The patient's family history includes Liver cancer in his father; Lung cancer in his paternal uncle.   ROS:   Please see the history of present illness.    Smoking has resumed. Dyspnea on exertion is intermittent. Chest discomfort is not predictable and seems to have no relationship to physical activity.  All other systems reviewed and are negative.   PHYSICAL EXAM:   VS:  There were no vitals taken for this visit.   GEN: Well nourished, well developed, in no acute distress  HEENT: normal  Neck: no JVD, carotid bruits, or masses Cardiac: RRR; no murmurs, rubs, or gallops,no edema  Respiratory:  clear to auscultation bilaterally, normal work of breathing GI: soft, nontender, nondistended, + BS MS: no deformity or atrophy  Skin: warm and dry, no rash Neuro:  Alert and Oriented x 3, Strength and sensation are intact Psych: euthymic mood, full affect  Wt Readings from Last 3 Encounters:  04/07/17 231 lb (104.8 kg)  04/02/17 231 lb (104.8 kg)  01/28/17 227 lb (103 kg)      Studies/Labs Reviewed:   EKG:  EKG  No new study  Recent Labs: 08/18/2016: BUN 14; Creatinine, Ser 0.70; Potassium 3.9; Sodium 138 04/07/2017: ALT 13   Lipid Panel    Component Value Date/Time   CHOL 216 (H) 04/07/2017 0736   TRIG 339 (H) 04/07/2017 0736   HDL 34 (L) 04/07/2017 0736   CHOLHDL 6.4 (H) 04/07/2017 0736   CHOLHDL 4.1 01/20/2016 1047   VLDL 38 (H) 01/20/2016 1047   LDLCALC 114 (H) 04/07/2017 0736    Additional studies/ records that were reviewed today include:   Myocardial perfusion imaging performed on 04/07/17:  Overall Study Impression Myocardial perfusion is abnormal. Findings consistent with ischemia. This is an intermediate risk study. Overall left ventricular systolic function was abnormal. Nuclear stress EF: 52%. The left ventricular ejection fraction is mildly decreased (45-54%). There are no images available for comparison.      ASSESSMENT:    1.  Coronary artery disease involving native coronary artery of native heart with angina pectoris (HCC)   2. Essential hypertension   3. Hyperlipidemia, unspecified hyperlipidemia type   4. Tobacco abuse   5. Chronic diastolic heart failure (HCC)      PLAN:  In order of problems listed above:  1. Recurring chest discomfort, random episodes lasting up to 10 minutes associated with dyspnea. Intermediate risk myocardial perfusion study with inferior ischemia, which is expected with known right coronary total occlusion dependent upon collaterals from normal circumflex and stented LAD. We started beta blocker therapy. I am now asking him to use nitroglycerin every time he has chest discomfort. This will help quantitate the frequency of angina. He will follow-up in 4-8 weeks unless there is acceleration of symptoms. If symptoms begin to impact his lifestyle he will need to have coronary angiography. Right now we are working on risk factor  modification which should include excellent lipid control, diabetes control with LDL less than 7, and smoking cessation. 2. Excellent blood pressure control 3. LDL target less than 70 4. Encouraged tobacco cessation. 5. No evidence of volume overload.  We reviewed the digital images from the 2015 study where the LAD was stented. Circumflex was normal at that time. LAD had no other significant disease. Distal RCA was totally occluded with right to right and also left to right collaterals to the PDA. With his symptoms occurring sporadically, I'm concerned about the possibility of an active plaque. There was no evidence of LAD and circumflex ischemia on the nuclear study. Clinical observation for another 4 weeks or so. Nitroglycerin use will help determine whether invasive management is necessary. Continue low-dose beta blocker therapy which was recently added.  Medication Adjustments/Labs and Tests Ordered: Current medicines are reviewed at length with the patient today.   Concerns regarding medicines are outlined above.  Medication changes, Labs and Tests ordered today are listed in the Patient Instructions below. There are no Patient Instructions on file for this visit.   Signed, Lesleigh Noe, MD  04/22/2017 4:32 PM    [facility] [address] [place], [place], [place]  [address] Phone: [phone]; Fax: [phone]

## 2017-04-23 ENCOUNTER — Ambulatory Visit (INDEPENDENT_AMBULATORY_CARE_PROVIDER_SITE_OTHER): Payer: BLUE CROSS/BLUE SHIELD | Admitting: Interventional Cardiology

## 2017-04-23 ENCOUNTER — Encounter: Payer: Self-pay | Admitting: Interventional Cardiology

## 2017-04-23 VITALS — BP 106/76 | HR 92 | Ht 71.0 in | Wt 230.0 lb

## 2017-04-23 DIAGNOSIS — E785 Hyperlipidemia, unspecified: Secondary | ICD-10-CM

## 2017-04-23 DIAGNOSIS — I5032 Chronic diastolic (congestive) heart failure: Secondary | ICD-10-CM

## 2017-04-23 DIAGNOSIS — Z720 Tobacco use: Secondary | ICD-10-CM | POA: Diagnosis not present

## 2017-04-23 DIAGNOSIS — I25119 Atherosclerotic heart disease of native coronary artery with unspecified angina pectoris: Secondary | ICD-10-CM | POA: Diagnosis not present

## 2017-04-23 DIAGNOSIS — I10 Essential (primary) hypertension: Secondary | ICD-10-CM | POA: Diagnosis not present

## 2017-04-23 NOTE — Patient Instructions (Signed)
Medication Instructions:  1) If chest tightness occurs, you should sit down and take a Nitroglycerin.  When you return to see Dr. Katrinka BlazingSmith please let us know if this helped.  Labwork: None  Testing/Procedures: None  Follow-Up: Your physician recommends that you schedule a follow-up appointment in: 6-8 weeks with Dr. Katrinka BlazingSmith.    Any Other Special Instructions Will Be Listed Below (If Applicable).     If you need a refill on your cardiac medications before your next appointment, please call your pharmacy.

## 2017-05-11 ENCOUNTER — Encounter: Payer: Self-pay | Admitting: *Deleted

## 2017-05-20 ENCOUNTER — Other Ambulatory Visit: Payer: BLUE CROSS/BLUE SHIELD | Admitting: *Deleted

## 2017-05-20 DIAGNOSIS — E785 Hyperlipidemia, unspecified: Secondary | ICD-10-CM

## 2017-05-20 LAB — LIPID PANEL
Chol/HDL Ratio: 5.1 ratio — ABNORMAL HIGH (ref 0.0–5.0)
Cholesterol, Total: 163 mg/dL (ref 100–199)
HDL: 32 mg/dL — AB (ref >39)
LDL CALC: 75 mg/dL (ref 0–99)
Triglycerides: 280 mg/dL — ABNORMAL HIGH (ref 0–149)
VLDL CHOLESTEROL CAL: 56 mg/dL — AB (ref 5–40)

## 2017-05-20 LAB — HEPATIC FUNCTION PANEL
ALBUMIN: 4 g/dL (ref 3.5–5.5)
ALT: 20 IU/L (ref 0–44)
AST: 18 IU/L (ref 0–40)
Alkaline Phosphatase: 67 IU/L (ref 39–117)
BILIRUBIN TOTAL: 0.7 mg/dL (ref 0.0–1.2)
BILIRUBIN, DIRECT: 0.16 mg/dL (ref 0.00–0.40)
Total Protein: 6.3 g/dL (ref 6.0–8.5)

## 2017-05-26 ENCOUNTER — Ambulatory Visit: Payer: BLUE CROSS/BLUE SHIELD | Admitting: Interventional Cardiology

## 2017-05-27 ENCOUNTER — Encounter: Payer: Self-pay | Admitting: Interventional Cardiology

## 2017-06-27 NOTE — Progress Notes (Signed)
Cardiology Office Note    Date:  06/28/2017   ID:  Orvel, Cutsforth 10/29/1966, MRN [id number]  PCP:  Fleet Contras, MD  Cardiologist: Lesleigh Noe, MD   Chief Complaint  Patient presents with  . Coronary Artery Disease    History of Present Illness:  Tristan Camacho is a 51 y.o. male with coronary artery disease including chronic occlusion of RCA, drug-eluting stent LAD 2015, type 2 diabetes, hyperlipidemia, essential hypertension, and tobacco use.  Tristan Camacho is doing well. He has mild left chest discomfort with heavy physical activity that goes away promptly with rest. As he has become more active and discontinue smoking, he states of exertional tolerance has improved. He denies orthopnea, PND, chest pain, and syncope. I instructed him to use nitroglycerin for chest discomfort that lasts longer than 2-3 minutes. He has not needed to take any nitroglycerin since the last office visit. They're just back from a [place] vacation to [place], and [place] with a lot of walking. He was able to enjoy himself and was not limited in ability to site see.  Past Medical History:  Diagnosis Date  . CAD (coronary artery disease)    a. 12/2013 Cath/PCI: LM nl, LAD 90p (3.0x16 Promus Premier DES), LCX nl, RCA 100, r->r, l->r collats, EF 60% w/o rwma.  . Diabetes mellitus without complication (HCC)   . GERD (gastroesophageal reflux disease)   . Hyperlipidemia   . Hypertension   . Tobacco abuse     Past Surgical History:  Procedure Laterality Date  . CORONARY STENT PLACEMENT    . LEFT HEART CATHETERIZATION WITH CORONARY ANGIOGRAM Bilateral 12/20/2013   Procedure: LEFT HEART CATHETERIZATION WITH CORONARY ANGIOGRAM;  Surgeon: Lesleigh Noe, MD;  Location: [facility] CATH LAB;  Service: Cardiovascular;  Laterality: Bilateral;  . SHOULDER ARTHROSCOPY WITH ROTATOR CUFF REPAIR AND SUBACROMIAL DECOMPRESSION Left 08/24/2016   Procedure: SHOULDER ARTHROSCOPY WITH SUBACROMIAL DECOMPRESSION AND  DEBRIDEMENT, Distal Clavicle Excision, Arthroscopic rotator cuff repair;  Surgeon: Jones Broom, MD;  Location: [facility];  Service: Orthopedics;  Laterality: Left;  SHOULDER ARTHROSCOPY WITH SUBACROMIAL DECOMPRESSION AND DEBRIDEMENT  . SHOULDER SURGERY Right     Current Medications: Outpatient Medications Prior to Visit  Medication Sig Dispense Refill  . aspirin EC 81 MG tablet Take 1 tablet (81 mg total) by mouth daily.    . canagliflozin (INVOKANA) 300 MG TABS tablet Take 1 tablet (300 mg total) by mouth daily. PATIENT NEEDS OFFICE VISIT FOR ADDITIONAL REFILLS 30 tablet 0  . cholecalciferol (VITAMIN D) 1000 UNITS tablet Take 1,000 Units by mouth daily. Reported on 01/20/2016    . lisinopril-hydrochlorothiazide (PRINZIDE,ZESTORETIC) 10-12.5 MG tablet Take 1 tablet by mouth daily.    . metoprolol succinate (TOPROL XL) 25 MG 24 hr tablet Take 1 tablet (25 mg total) by mouth daily. 90 tablet 3  . nitroGLYCERIN (NITROSTAT) 0.4 MG SL tablet Place 1 tablet (0.4 mg total) under the tongue every 5 (five) minutes x 3 doses as needed for chest pain. 25 tablet 3  . omeprazole (PRILOSEC) 20 MG capsule take 1 capsule by mouth twice a day 30 capsule 0  . tamsulosin (FLOMAX) 0.4 MG CAPS capsule Take 1 capsule (0.4 mg total) by mouth daily after supper. Reported on 01/20/2016 30 capsule 11  . vitamin C (ASCORBIC ACID) 500 MG tablet Take 500 mg by mouth daily.     No facility-administered medications prior to visit.      Allergies:   Patient has no known allergies.  Social History   Social History  . Marital status: Married    Spouse name: N/A  . Number of children: N/A  . Years of education: N/A   Social History Main Topics  . Smoking status: Former Smoker    Packs/day: 0.25    Quit date: 04/09/2017  . Smokeless tobacco: Never Used  . Alcohol use No  . Drug use: No  . Sexual activity: Yes   Other Topics Concern  . None   Social History Narrative  . None     Family  History:  The patient's family history includes Liver cancer in his father; Lung cancer in his paternal uncle.   ROS:   Please see the history of present illness.    Leg swelling, chest pressure, irregular heartbeat, snoring, constipation, difficulty urinating, back pain, right leg pain when standing, and rash.  All other systems reviewed and are negative.   PHYSICAL EXAM:   VS:  BP 112/80 (BP Location: Right Arm)   Pulse 88   Ht 6' (1.829 m)   Wt 236 lb 12.8 oz (107.4 kg)   BMI 32.12 kg/m    GEN: Well nourished, well developed, in no acute distress  HEENT: normal  Neck: no JVD, carotid bruits, or masses Cardiac: RRR; no murmurs, rubs, or gallops,no edema . Peripheral pulses are 2+ and symmetric. Respiratory:  clear to auscultation bilaterally, normal work of breathing GI: soft, nontender, nondistended, + BS MS: no deformity or atrophy  Skin: warm and dry, no rash Neuro:  Alert and Oriented x 3, Strength and sensation are intact Psych: euthymic mood, full affect  Wt Readings from Last 3 Encounters:  06/28/17 236 lb 12.8 oz (107.4 kg)  04/23/17 230 lb (104.3 kg)  04/07/17 231 lb (104.8 kg)      Studies/Labs Reviewed:   EKG:  EKG  Normal sinus rhythm, left anterior hemiblock, and no acute changes. When compared to prior tracings from 08/18/16, no changes occurred.  Recent Labs: 08/18/2016: BUN 14; Creatinine, Ser 0.70; Potassium 3.9; Sodium 138 05/20/2017: ALT 20   Lipid Panel    Component Value Date/Time   CHOL 163 05/20/2017 0843   TRIG 280 (H) 05/20/2017 0843   HDL 32 (L) 05/20/2017 0843   CHOLHDL 5.1 (H) 05/20/2017 0843   CHOLHDL 4.1 01/20/2016 1047   VLDL 38 (H) 01/20/2016 1047   LDLCALC 75 05/20/2017 0843    Additional studies/ records that were reviewed today include:  None    ASSESSMENT:    1. Coronary artery disease involving native coronary artery of native heart with angina pectoris (HCC)   2. Chronic diastolic heart failure (HCC)   3. Essential  hypertension   4. Uncontrolled type 2 diabetes mellitus with other circulatory complication, without long-term current use of insulin (HCC)   5. Hyperlipidemia, unspecified hyperlipidemia type      PLAN:  In order of problems listed above:  1. Angina, with a more or less stable pattern provoked only by heavy physical activity. For the time being we will continue with medical therapy. Option for coronary angiography was discussed but all agree that conservative management is acceptable at this time. 2. No evidence of volume overload. 3. Excellent blood pressure control. 4. Not discussed. 5. LDL target less than 70. LDL was 75 and July.  Aerobic activity, nitroglycerin of prolonged discomfort more than 2-3 minutes after stopping to rest. Notify us if any discomfort occurs at rest. Clinical follow-up in 4 months. If progression of angina, I have recommended coronary  angiography.  Medication Adjustments/Labs and Tests Ordered: Current medicines are reviewed at length with the patient today.  Concerns regarding medicines are outlined above.  Medication changes, Labs and Tests ordered today are listed in the Patient Instructions below. There are no Patient Instructions on file for this visit.   Signed, Lesleigh Noe, MD  06/28/2017 4:35 PM    [facility] [address] [place], [place], [place]  [address] Phone: [phone]; Fax: [phone]

## 2017-06-28 ENCOUNTER — Encounter: Payer: Self-pay | Admitting: Interventional Cardiology

## 2017-06-28 ENCOUNTER — Ambulatory Visit (INDEPENDENT_AMBULATORY_CARE_PROVIDER_SITE_OTHER): Payer: BLUE CROSS/BLUE SHIELD | Admitting: Interventional Cardiology

## 2017-06-28 VITALS — BP 112/80 | HR 88 | Ht 72.0 in | Wt 236.8 lb

## 2017-06-28 DIAGNOSIS — E1165 Type 2 diabetes mellitus with hyperglycemia: Secondary | ICD-10-CM

## 2017-06-28 DIAGNOSIS — IMO0002 Reserved for concepts with insufficient information to code with codable children: Secondary | ICD-10-CM

## 2017-06-28 DIAGNOSIS — I25119 Atherosclerotic heart disease of native coronary artery with unspecified angina pectoris: Secondary | ICD-10-CM

## 2017-06-28 DIAGNOSIS — I10 Essential (primary) hypertension: Secondary | ICD-10-CM | POA: Diagnosis not present

## 2017-06-28 DIAGNOSIS — I5032 Chronic diastolic (congestive) heart failure: Secondary | ICD-10-CM

## 2017-06-28 DIAGNOSIS — E785 Hyperlipidemia, unspecified: Secondary | ICD-10-CM

## 2017-06-28 DIAGNOSIS — E1159 Type 2 diabetes mellitus with other circulatory complications: Secondary | ICD-10-CM | POA: Diagnosis not present

## 2017-06-28 NOTE — Patient Instructions (Signed)
Medication Instructions:  None  Labwork: None  Testing/Procedures: None  Follow-Up: Your physician recommends that you schedule a follow-up appointment in: 4 months with Dr. Smith.    Any Other Special Instructions Will Be Listed Below (If Applicable).     If you need a refill on your cardiac medications before your next appointment, please call your pharmacy.   

## 2017-07-01 IMAGING — NM NM MISC PROCEDURE
6 series · 36 of 36 positions shown · non-contrast
Comparison: none

[Series 1: wbr_r-proj_st rest · 6.51mm/px · 6 of 64 frames shown]
[frame 6/64]
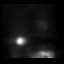
[frame 16/64]
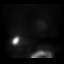
[frame 27/64]
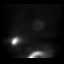
[frame 38/64]
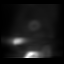
[frame 48/64]
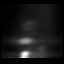
[frame 59/64]
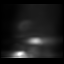

[Series 1: rest · 6.51mm/px · 6 of 64 frames shown]
[frame 6/64]
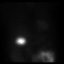
[frame 16/64]
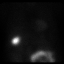
[frame 27/64]
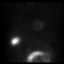
[frame 38/64]
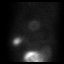
[frame 48/64]
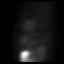
[frame 59/64]
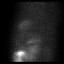

[Series 2: stress · 6.51mm/px · 6 of 64 frames shown (1 of 2)]
[frame 6/64]
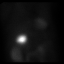
[frame 16/64]
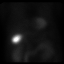
[frame 27/64]
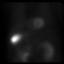
[frame 38/64]
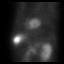
[frame 48/64]
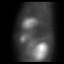
[frame 59/64]
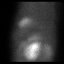

[Series 2: wbr_s-proj_st stress · 6.51mm/px · 6 of 64 frames shown (1 of 2)]
[frame 6/64]
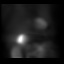
[frame 16/64]
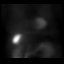
[frame 27/64]
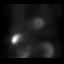
[frame 38/64]
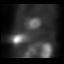
[frame 48/64]
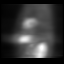
[frame 59/64]
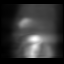

[Series 2: wbr_s-proj_st stress · 6.51mm/px · 6 of 512 frames shown (2 of 2)]
[frame 43/512]
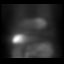
[frame 128/512]
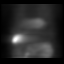
[frame 214/512]
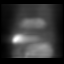
[frame 299/512]
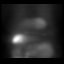
[frame 384/512]
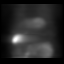
[frame 470/512]
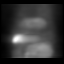

[Series 2: stress · 6.51mm/px · 6 of 512 frames shown (2 of 2)]
[frame 43/512]
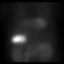
[frame 128/512]
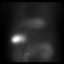
[frame 214/512]
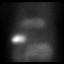
[frame 299/512]
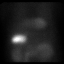
[frame 384/512]
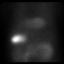
[frame 470/512]
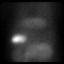

[36 of 36 positions shown; findings below may reference images not displayed]

Canned report from images found in remote index.

Refer to host system for actual result text.

## 2017-07-19 ENCOUNTER — Ambulatory Visit: Payer: BLUE CROSS/BLUE SHIELD | Admitting: Podiatry

## 2017-09-25 ENCOUNTER — Ambulatory Visit (HOSPITAL_COMMUNITY)
Admission: EM | Admit: 2017-09-25 | Discharge: 2017-09-25 | Disposition: A | Discharge status: Home / Self Care | Payer: BLUE CROSS/BLUE SHIELD | Attending: Family Medicine | Admitting: Family Medicine

## 2017-09-25 ENCOUNTER — Encounter (HOSPITAL_COMMUNITY): Payer: Self-pay | Admitting: Family Medicine

## 2017-09-25 DIAGNOSIS — L0100 Impetigo, unspecified: Secondary | ICD-10-CM | POA: Diagnosis not present

## 2017-09-25 DIAGNOSIS — R21 Rash and other nonspecific skin eruption: Secondary | ICD-10-CM | POA: Diagnosis not present

## 2017-09-25 MED ORDER — CEPHALEXIN 500 MG PO CAPS: 500.0000 mg | ORAL_CAPSULE | Freq: Two times a day (BID) | ORAL | 0 refills | Status: AC

## 2017-09-25 NOTE — Discharge Instructions (Signed)
You may have a small skin infection where you have been scratching. Treat with 5 days of antibiotics. Keep clean with antibacterial soap and water. Cover with zinc oxide cream (buy at drug store) twice a day and keep covered. If this worsens or are further concerns f/u here or with PCP.

## 2017-09-25 NOTE — ED Triage Notes (Signed)
Pt here for rash to right lower abdomen. Sharp stabbing pain.

## 2017-09-25 NOTE — ED Provider Notes (Signed)
[facility]    CSN: [id number] Arrival date & time: 09/25/17  1231     History   Chief Complaint Chief Complaint  Patient presents with  . Rash    HPI Tristan Camacho is a 51 y.o. male.   Presents with a "sore" to lower right abdomen. Assistance with interpretation through his wife who is present. Patient initially had a "itchy" rash to that area and he has been scratching for a week. Over the last few days the area of itching has resulted in a sore that now is painful and "stabbing". No drainage. No additional sores or rashes. No fever or chills.       Past Medical History:  Diagnosis Date  . CAD (coronary artery disease)    a. 12/2013 Cath/PCI: LM nl, LAD 90p (3.0x16 Promus Premier DES), LCX nl, RCA 100, r->r, l->r collats, EF 60% w/o rwma.  . Diabetes mellitus without complication (HCC)   . GERD (gastroesophageal reflux disease)   . Hyperlipidemia   . Hypertension   . Tobacco abuse     Patient Active Problem List   Diagnosis Date Noted  . Chronic diastolic heart failure (HCC) 01/25/2014  . Coronary artery disease involving native coronary artery of native heart with angina pectoris (HCC) 12/21/2013  . Tobacco abuse 12/21/2013  . Hyperlipidemia   . Diabetes mellitus type II, uncontrolled (HCC) 12/19/2013  . HTN (hypertension) 12/19/2013    Past Surgical History:  Procedure Laterality Date  . CORONARY STENT PLACEMENT    . LEFT HEART CATHETERIZATION WITH CORONARY ANGIOGRAM Bilateral 12/20/2013   Performed by Lyn Records, MD at [facility] CATH LAB  . SHOULDER ARTHROSCOPY WITH SUBACROMIAL DECOMPRESSION AND DEBRIDEMENT, Distal Clavicle Excision, Arthroscopic rotator cuff repair Left 08/24/2016   Performed by Jones Broom, MD at [facility]  . SHOULDER SURGERY Right        Home Medications    Prior to Admission medications   Medication Sig Start Date End Date Taking? Authorizing Provider  aspirin EC 81 MG tablet Take 1 tablet (81 mg  total) by mouth daily. 02/22/14   Lyn Records, MD  canagliflozin (INVOKANA) 300 MG TABS tablet Take 1 tablet (300 mg total) by mouth daily. PATIENT NEEDS OFFICE VISIT FOR ADDITIONAL REFILLS 01/20/16   Collene Gobble, MD  cholecalciferol (VITAMIN D) 1000 UNITS tablet Take 1,000 Units by mouth daily. Reported on 01/20/2016    [provider]  clindamycin (CLEOCIN T) 1 % lotion Apply 1 application topically 2 (two) times daily. 05/21/17   [provider]  finasteride (PROSCAR) 5 MG tablet Take 5 mg by mouth daily. 05/04/17   [provider]  LANTUS SOLOSTAR 100 UNIT/ML Solostar Pen Inject 50 Units into the skin every morning. 05/21/17   [provider]  lisinopril-hydrochlorothiazide (PRINZIDE,ZESTORETIC) 10-12.5 MG tablet Take 1 tablet by mouth daily.    [provider]  metoprolol succinate (TOPROL XL) 25 MG 24 hr tablet Take 1 tablet (25 mg total) by mouth daily. 04/08/17   Lyn Records, MD  nitroGLYCERIN (NITROSTAT) 0.4 MG SL tablet Place 1 tablet (0.4 mg total) under the tongue every 5 (five) minutes x 3 doses as needed for chest pain. 05/29/15   Rosalio Macadamia, NP  omeprazole (PRILOSEC) 20 MG capsule take 1 capsule by mouth twice a day 12/24/16   Ofilia Neas, PA-C  pantoprazole (PROTONIX) 40 MG tablet Take 40 mg by mouth daily. 04/29/17   [provider]  tamsulosin (FLOMAX) 0.4  MG CAPS capsule Take 1 capsule (0.4 mg total) by mouth daily after supper. Reported on 01/20/2016 01/20/16   Collene Gobbleaub, Steven A, MD  vitamin C (ASCORBIC ACID) 500 MG tablet Take 500 mg by mouth daily.    [provider]    Family History Family History  Problem Relation Age of Onset  . Liver cancer Father   . Lung cancer Paternal Uncle     Social History Social History   Tobacco Use  . Smoking status: Former Smoker    Packs/day: 0.25    Last attempt to quit: 04/09/2017    Years since quitting: 0.4  . Smokeless tobacco: Never Used  Substance Use Topics    . Alcohol use: No  . Drug use: No     Allergies   Patient has no known allergies.   Review of Systems Review of Systems  All other systems reviewed and are negative.    Physical Exam Triage Vital Signs ED Triage Vitals  Enc Vitals Group     BP 09/25/17 1353 (!) 129/91     Pulse Rate 09/25/17 1353 89     Resp 09/25/17 1353 18     Temp 09/25/17 1353 98.3 F (36.8 C)     Temp Source 09/25/17 1353 Oral     SpO2 09/25/17 1353 97 %     Weight --      Height --      Head Circumference --      Peak Flow --      Pain Score 09/25/17 1351 7     Pain Loc --      Pain Edu? --      Excl. in GC? --    No data found.  Updated Vital Signs BP (!) 129/91   Pulse 89   Temp 98.3 F (36.8 C) (Oral)   Resp 18   SpO2 97%   Visual Acuity Right Eye Distance:   Left Eye Distance:   Bilateral Distance:    Right Eye Near:   Left Eye Near:    Bilateral Near:     Physical Exam  Constitutional: He appears well-developed and well-nourished. No distress.  Pulmonary/Chest: Effort normal.  Skin: Skin is warm and dry. Rash noted. He is not diaphoretic. No erythema.  Small .5cm annular wound to lower right abdomen. No drainage though mild erythema and warmth surrounding. Mildly tender  Nursing note and vitals reviewed.    UC Treatments / Results  Labs (all labs ordered are listed, but only abnormal results are displayed) Labs Reviewed - No data to display  EKG  EKG Interpretation None       Radiology No results found.  Procedures Procedures (including critical care time)  Medications Ordered in UC Medications - No data to display   Initial Impression / Assessment and Plan / UC Course  I have reviewed the triage vital signs and the nursing notes.  Pertinent labs & imaging results that were available during my care of the patient were reviewed by me and considered in my medical decision making (see chart for details).   Treat for mild cellulitis from probable  scratching. No additional or worrisome rash. Wound instruction given with f/u here or with PCP if needed.    Final Clinical Impressions(s) / UC Diagnoses   Final diagnoses:  None    ED Discharge Orders    None       Controlled Substance Prescriptions [place] Controlled Substance Registry consulted? Not Applicable   Riki SheerYoung, Tamia Dial G,  PA-C 09/25/17 1421

## 2017-10-22 ENCOUNTER — Encounter: Payer: Self-pay | Admitting: Interventional Cardiology

## 2017-10-27 NOTE — Progress Notes (Signed)
Cardiology Office Note    Date:  10/28/2017   ID:  Tristan Camacho, DOB 1966-08-07, MRN [id number]  PCP:  Fleet ContrasAvbuere, Edwin, MD  Cardiologist: Tristan NoeHenry W Smith III, MD   Chief Complaint  Patient presents with  . Coronary Artery Disease  . Numbness    History of Present Illness:  Tristan Camacho is a 51 y.o. male with coronary artery disease including chronic occlusion of RCA, drug-eluting stent LAD 2015, type 2 diabetes, hyperlipidemia, essential hypertension, and tobacco use.   He is doing relatively well.  He is accompanied by an interpreter and his wife.  Despite the help we still have a language barrier.  The patient is basically unchanged from 6 months ago.  He does complain of some discomfort in the chest that lasts for 1-2 minutes.  It occurs spontaneously and resolves.  If he walks real fast and or hard he may get some tightness in the chest that resolves with rest.  He is not using nitroglycerin.  He denies orthopnea and PND.  He occasionally has palpitations.   Past Medical History:  Diagnosis Date  . CAD (coronary artery disease)    a. 12/2013 Cath/PCI: LM nl, LAD 90p (3.0x16 Promus Premier DES), LCX nl, RCA 100, r->r, l->r collats, EF 60% w/o rwma.  . Diabetes mellitus without complication (HCC)   . GERD (gastroesophageal reflux disease)   . Hyperlipidemia   . Hypertension   . Tobacco abuse     Past Surgical History:  Procedure Laterality Date  . CORONARY STENT PLACEMENT    . LEFT HEART CATHETERIZATION WITH CORONARY ANGIOGRAM Bilateral 12/20/2013   Procedure: LEFT HEART CATHETERIZATION WITH CORONARY ANGIOGRAM;  Surgeon: Tristan NoeHenry W Smith III, MD;  [facility] C[facility] LAB;  Service: Cardiovascular;  Laterality: Bilateral;  . SHOULDER ARTHROSCOPY WITH ROTATOR CUFF REPAIR AND SUBACROMIAL DECOMPRESSION Left 08/24/2016   Procedure: SHOULDER ARTHROSCOPY WITH SUBACROMIAL DECOMPRESSION AND DEBRIDEMENT, Distal Clavicle Excision, Arthroscopic rotator cuff repair;  Surgeon: Jones BroomJustin Chandler, MD;   Location: [facility];  Service: Orthopedics;  Laterality: Left;  SHOULDER ARTHROSCOPY WITH SUBACROMIAL DECOMPRESSION AND DEBRIDEMENT  . SHOULDER SURGERY Right     Current Medications: Outpatient Medications Prior to Visit  Medication Sig Dispense Refill  . aspirin EC 81 MG tablet Take 1 tablet (81 mg total) by mouth daily.    . canagliflozin (INVOKANA) 300 MG TABS tablet Take 1 tablet (300 mg total) by mouth daily. PATIENT NEEDS OFFICE VISIT FOR ADDITIONAL REFILLS 30 tablet 0  . finasteride (PROSCAR) 5 MG tablet Take 5 mg by mouth daily.  7  . LANTUS SOLOSTAR 100 UNIT/ML Solostar Pen Inject 50 Units into the skin every morning.  5  . lisinopril-hydrochlorothiazide (PRINZIDE,ZESTORETIC) 10-12.5 MG tablet Take 1 tablet by mouth daily.    . metoprolol succinate (TOPROL XL) 25 MG 24 hr tablet Take 1 tablet (25 mg total) by mouth daily. 90 tablet 3  . nitroGLYCERIN (NITROSTAT) 0.4 MG SL tablet Place 1 tablet (0.4 mg total) under the tongue every 5 (five) minutes x 3 doses as needed for chest pain. 25 tablet 3  . omeprazole (PRILOSEC) 20 MG capsule take 1 capsule by mouth twice a day 30 capsule 0  . pantoprazole (PROTONIX) 40 MG tablet Take 40 mg by mouth daily.  0  . tamsulosin (FLOMAX) 0.4 MG CAPS capsule Take 1 capsule (0.4 mg total) by mouth daily after supper. Reported on 01/20/2016 30 capsule 11  . vitamin C (ASCORBIC ACID) 500 MG tablet Take 500 mg by mouth  daily.    . cholecalciferol (VITAMIN D) 1000 UNITS tablet Take 1,000 Units by mouth daily. Reported on 01/20/2016    . clindamycin (CLEOCIN T) 1 % lotion Apply 1 application topically 2 (two) times daily.  2   No facility-administered medications prior to visit.      Allergies:   Patient has no known allergies.   Social History   Socioeconomic History  . Marital status: Married    Spouse name: None  . Number of children: None  . Years of education: None  . Highest education level: None  Social Needs  . Financial  resource strain: None  . Food insecurity - worry: None  . Food insecurity - inability: None  . Transportation needs - medical: None  . Transportation needs - non-medical: None  Occupational History  . None  Tobacco Use  . Smoking status: Former Smoker    Packs/day: 0.25    Last attempt to quit: 04/09/2017    Years since quitting: 0.5  . Smokeless tobacco: Never Used  Substance and Sexual Activity  . Alcohol use: No  . Drug use: No  . Sexual activity: Yes  Other Topics Concern  . None  Social History Narrative  . None     Family History:  The patient's family history includes Liver cancer in his father; Lung cancer in his paternal uncle.   ROS:   Please see the history of present illness.    Complains of numbness and tingling on the dorsum of his feet bilaterally.  Legs have decreased energy with physical activity. All other systems reviewed and are negative.   PHYSICAL EXAM:   VS:  BP 136/86   Pulse 96   Ht 6' (1.829 m)   Wt 241 lb 12.8 oz (109.7 kg)   BMI 32.79 kg/m    GEN: Well nourished, well developed, in no acute distress  HEENT: normal  Neck: no JVD, carotid bruits, or masses Cardiac: RRR; no murmurs, rubs, or gallops,no edema.  Pedal pulses are not palpable. Respiratory:  clear to auscultation bilaterally, normal work of breathing GI: soft, nontender, nondistended, + BS MS: no deformity or atrophy  Skin: warm and dry, no rash Neuro:  Alert and Oriented x 3, Strength and sensation are intact Psych: euthymic mood, full affect  Wt Readings from Last 3 Encounters:  10/28/17 241 lb 12.8 oz (109.7 kg)  06/28/17 236 lb 12.8 oz (107.4 kg)  04/23/17 230 lb (104.3 kg)      Studies/Labs Reviewed:   EKG:  EKG not repeated  Recent Labs: 05/20/2017: ALT 20   Lipid Panel    Component Value Date/Time   CHOL 163 05/20/2017 0843   TRIG 280 (H) 05/20/2017 0843   HDL 32 (L) 05/20/2017 0843   CHOLHDL 5.1 (H) 05/20/2017 0843   CHOLHDL 4.1 01/20/2016 1047   VLDL 38  (H) 01/20/2016 1047   LDLCALC 75 05/20/2017 0843    Additional studies/ records that were reviewed today include:  None    ASSESSMENT:    1. Chronic diastolic heart failure (HCC)   2. Coronary artery disease involving native coronary artery of native heart with angina pectoris (HCC)   3. Essential hypertension   4. Uncontrolled type 2 diabetes mellitus with hyperglycemia (HCC)   5. Other hyperlipidemia   6. Tobacco abuse   7. Absent pedal pulses   8. Decreased pedal pulses      PLAN:  In order of problems listed above:  1. No volume overload 2. Stable angina  pectoris 3. Excellent control currently 4. His weight is too high.  Decrease caloric intake and starches in the diet.  Aerobic activity is encouraged.  Hemoglobin A1c less than 7 is target. 5. LDL less than 70 as target. 6. Has discontinued cigarette smoking! 7. Bilateral lower extremity arterial Doppler study.  Increase physical activity, cut back on carbohydrates in diet, bilateral lower extremity arterial Doppler to exclude PAD.  Clinical follow-up in 6 months.  Medication Adjustments/Labs and Tests Ordered: Current medicines are reviewed at length with the patient today.  Concerns regarding medicines are outlined above.  Medication changes, Labs and Tests ordered today are listed in the Patient Instructions below. Patient Instructions  Medication Instructions:  Your physician recommends that you continue on your current medications as directed. Please refer to the Current Medication list given to you today.  Labwork: None  Testing/Procedures: Your physician has requested that you have a lower extremity arterial duplex. This test is an ultrasound of the arteries in the legs. It looks at arterial blood flow in the legs. Allow one hour for Lower Arterial scans. There are no restrictions or special instructions   Follow-Up: Your physician wants you to follow-up in: 6 months with Dr. Katrinka BlazingSmith.  You will receive a  reminder letter in the mail two months in advance. If you don't receive a letter, please call our office to schedule the follow-up appointment.   Any Other Special Instructions Will Be Listed Below (If Applicable).  Your physician recommends that you decrease carbohydrates in your diet and increase vegetables.  He also recommends that you exercise more.    Please contact our office if you have any chest discomfort.  If you need a refill on your cardiac medications before your next appointment, please call your pharmacy.      Signed, Tristan NoeHenry W Smith III, MD  10/28/2017 10:06 AM    [facility] [address], [place], [place]  [address] Phone: [phone]; Fax: [phone]

## 2017-10-28 ENCOUNTER — Encounter: Payer: Self-pay | Admitting: Interventional Cardiology

## 2017-10-28 ENCOUNTER — Ambulatory Visit: Payer: BLUE CROSS/BLUE SHIELD | Admitting: Interventional Cardiology

## 2017-10-28 VITALS — BP 136/86 | HR 96 | Ht 72.0 in | Wt 241.8 lb

## 2017-10-28 DIAGNOSIS — R0989 Other specified symptoms and signs involving the circulatory and respiratory systems: Secondary | ICD-10-CM

## 2017-10-28 DIAGNOSIS — I5032 Chronic diastolic (congestive) heart failure: Secondary | ICD-10-CM | POA: Diagnosis not present

## 2017-10-28 DIAGNOSIS — E1165 Type 2 diabetes mellitus with hyperglycemia: Secondary | ICD-10-CM

## 2017-10-28 DIAGNOSIS — Z720 Tobacco use: Secondary | ICD-10-CM | POA: Diagnosis not present

## 2017-10-28 DIAGNOSIS — E7849 Other hyperlipidemia: Secondary | ICD-10-CM

## 2017-10-28 DIAGNOSIS — I25119 Atherosclerotic heart disease of native coronary artery with unspecified angina pectoris: Secondary | ICD-10-CM

## 2017-10-28 DIAGNOSIS — I10 Essential (primary) hypertension: Secondary | ICD-10-CM

## 2017-10-28 NOTE — Patient Instructions (Addendum)
Medication Instructions:  Your physician recommends that you continue on your current medications as directed. Please refer to the Current Medication list given to you today.  Labwork: None  Testing/Procedures: Your physician has requested that you have a lower extremity arterial duplex. This test is an ultrasound of the arteries in the legs. It looks at arterial blood flow in the legs. Allow one hour for Lower Arterial scans. There are no restrictions or special instructions   Follow-Up: Your physician wants you to follow-up in: 6 months with Dr. Katrinka BlazingSmith.  You will receive a reminder letter in the mail two months in advance. If you don't receive a letter, please call our office to schedule the follow-up appointment.   Any Other Special Instructions Will Be Listed Below (If Applicable).  Your physician recommends that you decrease carbohydrates in your diet and increase vegetables.  He also recommends that you exercise more.    Please contact our office if you have any chest discomfort.  If you need a refill on your cardiac medications before your next appointment, please call your pharmacy.

## 2017-11-03 ENCOUNTER — Other Ambulatory Visit: Payer: Self-pay | Admitting: Interventional Cardiology

## 2017-11-03 DIAGNOSIS — R0989 Other specified symptoms and signs involving the circulatory and respiratory systems: Secondary | ICD-10-CM

## 2017-11-08 ENCOUNTER — Ambulatory Visit (HOSPITAL_COMMUNITY)
Admission: RE | Admit: 2017-11-08 | Discharge: 2017-11-08 | Disposition: A | Discharge status: Home / Self Care | Payer: BLUE CROSS/BLUE SHIELD | Source: Ambulatory Visit | Attending: Internal Medicine | Admitting: Internal Medicine

## 2017-11-08 DIAGNOSIS — R0989 Other specified symptoms and signs involving the circulatory and respiratory systems: Secondary | ICD-10-CM | POA: Diagnosis present

## 2017-11-16 ENCOUNTER — Telehealth: Payer: Self-pay | Admitting: Interventional Cardiology

## 2017-11-16 NOTE — Telephone Encounter (Signed)
New message   Please call with results LE ART SEG MULTI . Please call

## 2017-11-16 NOTE — Telephone Encounter (Signed)
Spoke with wife, DPR on file.  Made her aware of results.  Wife appreciative for call.

## 2018-02-16 ENCOUNTER — Other Ambulatory Visit: Payer: Self-pay | Admitting: *Deleted

## 2018-02-16 MED ORDER — NITROGLYCERIN 0.4 MG SL SUBL: 0.4000 mg | SUBLINGUAL_TABLET | SUBLINGUAL | 1 refills | Status: DC | PRN

## 2019-02-01 ENCOUNTER — Other Ambulatory Visit: Payer: Self-pay

## 2019-02-01 MED ORDER — NITROGLYCERIN 0.4 MG SL SUBL: 0.4000 mg | SUBLINGUAL_TABLET | SUBLINGUAL | 1 refills | Status: AC | PRN

## 2024-07-05 ENCOUNTER — Other Ambulatory Visit: Payer: Self-pay

## 2024-07-05 ENCOUNTER — Encounter (HOSPITAL_BASED_OUTPATIENT_CLINIC_OR_DEPARTMENT_OTHER): Payer: Self-pay | Admitting: Emergency Medicine

## 2024-07-05 ENCOUNTER — Emergency Department (HOSPITAL_BASED_OUTPATIENT_CLINIC_OR_DEPARTMENT_OTHER)
Admission: EM | Admit: 2024-07-05 | Discharge: 2024-07-06 | Disposition: A | Discharge status: Home / Self Care | Attending: Emergency Medicine | Admitting: Emergency Medicine

## 2024-07-05 ENCOUNTER — Emergency Department (HOSPITAL_BASED_OUTPATIENT_CLINIC_OR_DEPARTMENT_OTHER)

## 2024-07-05 DIAGNOSIS — Z79899 Other long term (current) drug therapy: Secondary | ICD-10-CM | POA: Insufficient documentation

## 2024-07-05 DIAGNOSIS — Z7982 Long term (current) use of aspirin: Secondary | ICD-10-CM | POA: Diagnosis not present

## 2024-07-05 DIAGNOSIS — I10 Essential (primary) hypertension: Secondary | ICD-10-CM | POA: Insufficient documentation

## 2024-07-05 DIAGNOSIS — I2510 Atherosclerotic heart disease of native coronary artery without angina pectoris: Secondary | ICD-10-CM | POA: Diagnosis not present

## 2024-07-05 DIAGNOSIS — F17200 Nicotine dependence, unspecified, uncomplicated: Secondary | ICD-10-CM | POA: Insufficient documentation

## 2024-07-05 DIAGNOSIS — E119 Type 2 diabetes mellitus without complications: Secondary | ICD-10-CM | POA: Insufficient documentation

## 2024-07-05 DIAGNOSIS — R4020 Unspecified coma: Secondary | ICD-10-CM

## 2024-07-05 DIAGNOSIS — R55 Syncope and collapse: Secondary | ICD-10-CM | POA: Insufficient documentation

## 2024-07-05 DIAGNOSIS — R42 Dizziness and giddiness: Secondary | ICD-10-CM | POA: Diagnosis present

## 2024-07-05 LAB — COMPREHENSIVE METABOLIC PANEL WITH GFR
ALT: 14 U/L (ref 0–44)
AST: 17 U/L (ref 15–41)
Albumin: 4.1 g/dL (ref 3.5–5.0)
Alkaline Phosphatase: 100 U/L (ref 38–126)
Anion gap: 13 (ref 5–15)
BUN: 13 mg/dL (ref 6–20)
CO2: 20 mmol/L — ABNORMAL LOW (ref 22–32)
Calcium: 9.1 mg/dL (ref 8.9–10.3)
Chloride: 106 mmol/L (ref 98–111)
Creatinine, Ser: 0.72 mg/dL (ref 0.61–1.24)
GFR, Estimated: >60 mL/min (ref >60)
Glucose, Bld: 220 mg/dL — ABNORMAL HIGH (ref 70–99)
Potassium: 4 mmol/L (ref 3.5–5.1)
Sodium: 139 mmol/L (ref 135–145)
Total Bilirubin: 0.4 mg/dL (ref 0.0–1.2)
Total Protein: 7.2 g/dL (ref 6.5–8.1)

## 2024-07-05 LAB — CBC
HCT: 47.7 % (ref 39.0–52.0)
Hemoglobin: 16.3 g/dL (ref 13.0–17.0)
MCH: 30.3 pg (ref 26.0–34.0)
MCHC: 34.2 g/dL (ref 30.0–36.0)
MCV: 88.7 fL (ref 80.0–100.0)
Platelets: 207 K/uL (ref 150–400)
RBC: 5.38 MIL/uL (ref 4.22–5.81)
RDW: 13.1 % (ref 11.5–15.5)
WBC: 5.2 K/uL (ref 4.0–10.5)
nRBC: 0 % (ref 0.0–0.2)

## 2024-07-05 LAB — CBG MONITORING, ED: Glucose-Capillary: 241 mg/dL — ABNORMAL HIGH (ref 70–99)

## 2024-07-05 MED ORDER — MECLIZINE HCL 25 MG PO TABS
25.0000 mg | ORAL_TABLET | Freq: Once | ORAL | Status: AC
  Administered 2024-07-05: 25 mg via ORAL
  Filled 2024-07-05: qty 1

## 2024-07-05 MED ORDER — SODIUM CHLORIDE 0.9 % IV BOLUS
1000.0000 mL | Freq: Once | INTRAVENOUS | Status: AC
  Administered 2024-07-05: 1000 mL via INTRAVENOUS

## 2024-07-05 NOTE — ED Provider Notes (Signed)
 [place] EMERGENCY DEPARTMENT AT [facility] Provider Note   CSN: [id number] Arrival date & time: 07/05/24  2252     Patient presents with: Loss of Consciousness   Tristan Camacho is a 58 y.o. male.  {Add pertinent medical, surgical, social history, OB history to HPI:32947} HPI     This is a 58 year old male who presents with dizziness.  Wife helps provide history.  Reports that he has felt dizzy all day.  Describes it as lightheaded not room spinning.  States that he was sitting on the side of the bed when he began to feel lightheaded.  Lightheadedness is worse when his eyes are open.  He subsequently passed out and fell to the floor.  Denies any pain from falling to the floor.  Continues to feel dizzy and nauseated.  No prior history of the same.  Denies any neurologic deficits including weakness, numbness, gait disturbance.  Prior to Admission medications   Medication Sig Start Date End Date Taking? Authorizing Provider  aspirin  EC 81 MG tablet Take 1 tablet (81 mg total) by mouth daily. 02/22/14   Claudene Victory ORN, MD  canagliflozin  (INVOKANA ) 300 MG TABS tablet Take 1 tablet (300 mg total) by mouth daily. PATIENT NEEDS OFFICE VISIT FOR ADDITIONAL REFILLS 01/20/16   Humberto Elspeth LABOR, MD  finasteride (PROSCAR) 5 MG tablet Take 5 mg by mouth daily. 05/04/17   [provider]  LANTUS SOLOSTAR 100 UNIT/ML Solostar Pen Inject 50 Units into the skin every morning. 05/21/17   [provider]  lisinopril -hydrochlorothiazide  (PRINZIDE ,ZESTORETIC ) 10-12.5 MG tablet Take 1 tablet by mouth daily.    [provider]  metoprolol  succinate (TOPROL  XL) 25 MG 24 hr tablet Take 1 tablet (25 mg total) by mouth daily. 04/08/17   Claudene Victory ORN, MD  nitroGLYCERIN  (NITROSTAT ) 0.4 MG SL tablet Place 1 tablet (0.4 mg total) under the tongue every 5 (five) minutes x 3 doses as needed for chest pain. 02/01/19   Claudene Victory ORN, MD  omeprazole  (PRILOSEC) 20 MG capsule take 1 capsule by  mouth twice a day 12/24/16   Gretta Ozell CROME, PA-C  pantoprazole (PROTONIX) 40 MG tablet Take 40 mg by mouth daily. 04/29/17   [provider]  tamsulosin  (FLOMAX ) 0.4 MG CAPS capsule Take 1 capsule (0.4 mg total) by mouth daily after supper. Reported on 01/20/2016 01/20/16   Humberto Elspeth LABOR, MD  vitamin C (ASCORBIC ACID) 500 MG tablet Take 500 mg by mouth daily.    [provider]    Allergies: Patient has no known allergies.    Review of Systems  Constitutional:  Negative for fever.  Respiratory:  Negative for shortness of breath.   Cardiovascular:  Negative for chest pain.  Gastrointestinal:  Negative for abdominal pain.  Neurological:  Positive for dizziness and syncope. Negative for weakness.  All other systems reviewed and are negative.   Updated Vital Signs BP 116/85   Pulse 64   Resp 13   Wt 109.7 kg   SpO2 99%   BMI 32.80 kg/m   Physical Exam Vitals and nursing note reviewed.  Constitutional:      Appearance: He is well-developed. He is obese. He is ill-appearing. He is not toxic-appearing.     Comments: Eyes closed  HENT:     Head: Normocephalic and atraumatic.     Mouth/Throat:     Mouth: Mucous membranes are moist.  Eyes:     Pupils: Pupils are equal, round, and reactive to light.  Comments: No nystagmus noted  Cardiovascular:     Rate and Rhythm: Normal rate and regular rhythm.     Heart sounds: Normal heart sounds. No murmur heard. Pulmonary:     Effort: Pulmonary effort is normal. No respiratory distress.     Breath sounds: Normal breath sounds. No wheezing.  Abdominal:     Palpations: Abdomen is soft.     Tenderness: There is no abdominal tenderness. There is no rebound.  Musculoskeletal:     Cervical back: Neck supple.  Lymphadenopathy:     Cervical: No cervical adenopathy.  Skin:    General: Skin is warm and dry.  Neurological:     Mental Status: He is alert and oriented to person, place, and time.     Comments: Cranial nerves  II through XII intact, 5 out of 5 strength in all 4 extremities, no dysmetria to finger-nose-finger     (all labs ordered are listed, but only abnormal results are displayed) Labs Reviewed  COMPREHENSIVE METABOLIC PANEL WITH GFR - Abnormal; Notable for the following components:      Result Value   CO2 20 (*)    Glucose, Bld 220 (*)    All other components within normal limits  CBG MONITORING, ED - Abnormal; Notable for the following components:   Glucose-Capillary 241 (*)    All other components within normal limits  CBC  URINALYSIS, ROUTINE W REFLEX MICROSCOPIC  CBG MONITORING, ED    EKG: None  Radiology: CT Head Wo Contrast Result Date: 07/05/2024 CLINICAL DATA:  Syncope. EXAM: CT HEAD WITHOUT CONTRAST TECHNIQUE: Contiguous axial images were obtained from the base of the skull through the vertex without intravenous contrast. RADIATION DOSE REDUCTION: This exam was performed according to the departmental dose-optimization program which includes automated exposure control, adjustment of the mA and/or kV according to patient size and/or use of iterative reconstruction technique. COMPARISON:  None Available. FINDINGS: Brain: No evidence of acute infarction, hemorrhage, hydrocephalus, extra-axial collection or mass lesion/mass effect. Vascular: Mild bilateral cavernous carotid artery calcification is noted. Skull: Normal. Negative for fracture or focal lesion. Sinuses/Orbits: No acute finding. Other: None. IMPRESSION: No acute intracranial abnormality. Electronically Signed   By: Suzen Dials M.D.   On: 07/05/2024 23:52    {Document cardiac monitor, telemetry assessment procedure when appropriate:32947} Procedures   Medications Ordered in the ED  meclizine  (ANTIVERT ) tablet 25 mg (25 mg Oral Given 07/05/24 2353)  sodium chloride  0.9 % bolus 1,000 mL (1,000 mLs Intravenous New Bag/Given 07/05/24 2356)      {Click here for ABCD2, HEART and other calculators REFRESH Note before  signing:1}                              Medical Decision Making Amount and/or Complexity of Data Reviewed Labs: ordered. Radiology: ordered.   ***  {Document critical care time when appropriate  Document review of labs and clinical decision tools ie CHADS2VASC2, etc  Document your independent review of radiology images and any outside records  Document your discussion with family members, caretakers and with consultants  Document social determinants of health affecting pt's care  Document your decision making why or why not admission, treatments were needed:32947:::1}   Final diagnoses:  None    ED Discharge Orders     None

## 2024-07-05 NOTE — ED Triage Notes (Signed)
 Pt in with wife who reports she found him on the floor after apparent syncopal episode ago at home. Pt diaphoretic, nauseous and dizzy on ED arrival. Hx of DM and cardiac stents x 4 per wife

## 2024-07-05 NOTE — ED Notes (Signed)
 Patient transported to CT

## 2024-07-05 NOTE — ED Notes (Signed)
Lab specimens walked to lab by this RN

## 2024-07-06 LAB — URINALYSIS, ROUTINE W REFLEX MICROSCOPIC
Bacteria, UA: NONE SEEN
Bilirubin Urine: NEGATIVE
Glucose, UA: >1000 mg/dL — AB
Hgb urine dipstick: NEGATIVE
Ketones, ur: NEGATIVE mg/dL
Leukocytes,Ua: NEGATIVE
Nitrite: NEGATIVE
Protein, ur: NEGATIVE mg/dL
Specific Gravity, Urine: 1.032 — ABNORMAL HIGH (ref 1.005–1.030)
pH: 5 (ref 5.0–8.0)

## 2024-07-06 MED ORDER — DIAZEPAM 2 MG PO TABS
2.0000 mg | ORAL_TABLET | Freq: Once | ORAL | Status: AC
  Administered 2024-07-06: 2 mg via ORAL
  Filled 2024-07-06: qty 1

## 2024-07-06 MED ORDER — MECLIZINE HCL 25 MG PO TABS: 25.0000 mg | ORAL_TABLET | Freq: Three times a day (TID) | ORAL | 0 refills | Status: DC | PRN

## 2024-07-06 MED ORDER — MECLIZINE HCL 25 MG PO TABS: 25.0000 mg | ORAL_TABLET | Freq: Three times a day (TID) | ORAL | 0 refills | Status: AC | PRN

## 2024-07-06 MED ORDER — ONDANSETRON HCL 4 MG/2ML IJ SOLN
4.0000 mg | Freq: Once | INTRAMUSCULAR | Status: AC
  Administered 2024-07-06: 4 mg via INTRAVENOUS
  Filled 2024-07-06: qty 2

## 2024-07-06 NOTE — ED Notes (Signed)
 Attempted to ambulate pt, became dizzy, c/o room spinning when ambulating in the hall. Denies any nausea or headache

## 2024-07-06 NOTE — ED Notes (Signed)
 Patient ambulated without difficulty.  Reports improvement in dizziness

## 2024-07-06 NOTE — Discharge Instructions (Addendum)
 You were seen today for dizziness.  You may have developed a condition called vertigo.  Take medications as prescribed.  Follow-up with your primary doctor.   ?? ?????? ??? ??????. ?????? ??? ???? ? ????. ??? ?? ?????, ???? ?????. oneul eojileomjeung-eulo jinlyoleul bad-eusyeossseubnida. hyeongijeung-ilaneun jilhwan-i balsaenghaess-eul su issseubnida. cheobangdoen yag-eul bog-yonghasigo, juchiuiwa sangdamhaseyo.
